# Patient Record
Sex: Female | Born: 1975 | Race: White | Hispanic: Yes | Marital: Single | Smoking: Never smoker
Health system: Southern US, Community
[De-identification: ages and names within clinical notes are randomized; demographics above are authoritative.]

## PROBLEM LIST (undated history)

## (undated) DIAGNOSIS — K219 Gastro-esophageal reflux disease without esophagitis: Secondary | ICD-10-CM

## (undated) DIAGNOSIS — E119 Type 2 diabetes mellitus without complications: Secondary | ICD-10-CM

---

## 2022-01-06 ENCOUNTER — Encounter (HOSPITAL_BASED_OUTPATIENT_CLINIC_OR_DEPARTMENT_OTHER): Payer: Self-pay

## 2022-01-06 ENCOUNTER — Observation Stay (HOSPITAL_COMMUNITY): Payer: BLUE CROSS/BLUE SHIELD

## 2022-01-06 ENCOUNTER — Other Ambulatory Visit: Payer: Self-pay

## 2022-01-06 ENCOUNTER — Observation Stay (HOSPITAL_BASED_OUTPATIENT_CLINIC_OR_DEPARTMENT_OTHER)
Admission: EM | Admit: 2022-01-06 | Discharge: 2022-01-07 | Disposition: A | Discharge status: Home / Self Care | Payer: BLUE CROSS/BLUE SHIELD | Attending: Family Medicine | Admitting: Family Medicine

## 2022-01-06 ENCOUNTER — Emergency Department (HOSPITAL_BASED_OUTPATIENT_CLINIC_OR_DEPARTMENT_OTHER): Payer: BLUE CROSS/BLUE SHIELD

## 2022-01-06 DIAGNOSIS — E119 Type 2 diabetes mellitus without complications: Secondary | ICD-10-CM

## 2022-01-06 DIAGNOSIS — G459 Transient cerebral ischemic attack, unspecified: Principal | ICD-10-CM

## 2022-01-06 DIAGNOSIS — Z20822 Contact with and (suspected) exposure to covid-19: Secondary | ICD-10-CM | POA: Diagnosis not present

## 2022-01-06 DIAGNOSIS — Z6841 Body Mass Index (BMI) 40.0 and over, adult: Secondary | ICD-10-CM | POA: Diagnosis not present

## 2022-01-06 DIAGNOSIS — D509 Iron deficiency anemia, unspecified: Secondary | ICD-10-CM | POA: Diagnosis not present

## 2022-01-06 DIAGNOSIS — R202 Paresthesia of skin: Secondary | ICD-10-CM | POA: Diagnosis present

## 2022-01-06 HISTORY — DX: Type 2 diabetes mellitus without complications: E11.9

## 2022-01-06 HISTORY — DX: Gastro-esophageal reflux disease without esophagitis: K21.9

## 2022-01-06 LAB — CBC WITH DIFFERENTIAL/PLATELET
Abs Immature Granulocytes: 0.03 10*3/uL (ref 0.00–0.07)
Basophils Absolute: 0.1 10*3/uL (ref 0.0–0.1)
Basophils Relative: 1 %
Eosinophils Absolute: 0.2 10*3/uL (ref 0.0–0.5)
Eosinophils Relative: 3 %
HCT: 35.1 % — ABNORMAL LOW (ref 36.0–46.0)
Hemoglobin: 11.1 g/dL — ABNORMAL LOW (ref 12.0–15.0)
Immature Granulocytes: 0 %
Lymphocytes Relative: 32 %
Lymphs Abs: 2.6 10*3/uL (ref 0.7–4.0)
MCH: 22.8 pg — ABNORMAL LOW (ref 26.0–34.0)
MCHC: 31.6 g/dL (ref 30.0–36.0)
MCV: 72.1 fL — ABNORMAL LOW (ref 80.0–100.0)
Monocytes Absolute: 0.6 10*3/uL (ref 0.1–1.0)
Monocytes Relative: 7 %
Neutro Abs: 4.7 10*3/uL (ref 1.7–7.7)
Neutrophils Relative %: 57 %
Platelets: 333 10*3/uL (ref 150–400)
RBC: 4.87 MIL/uL (ref 3.87–5.11)
RDW: 15 % (ref 11.5–15.5)
WBC: 8.3 10*3/uL (ref 4.0–10.5)

## 2022-01-06 LAB — URINALYSIS, ROUTINE W REFLEX MICROSCOPIC
Bilirubin Urine: NEGATIVE
Glucose, UA: NEGATIVE mg/dL
Ketones, ur: NEGATIVE mg/dL
Leukocytes,Ua: NEGATIVE
Nitrite: NEGATIVE
Protein, ur: NEGATIVE mg/dL
Specific Gravity, Urine: 1.01 (ref 1.005–1.030)
pH: 6.5 (ref 5.0–8.0)

## 2022-01-06 LAB — LIPID PANEL
Cholesterol: 202 mg/dL — ABNORMAL HIGH (ref 0–200)
HDL: 53 mg/dL (ref >40)
LDL Cholesterol: 133 mg/dL — ABNORMAL HIGH (ref 0–99)
Total CHOL/HDL Ratio: 3.8 RATIO
Triglycerides: 80 mg/dL (ref <150)
VLDL: 16 mg/dL (ref 0–40)

## 2022-01-06 LAB — ETHANOL: Alcohol, Ethyl (B): <10 mg/dL (ref <10)

## 2022-01-06 LAB — URINALYSIS, MICROSCOPIC (REFLEX)

## 2022-01-06 LAB — COMPREHENSIVE METABOLIC PANEL
ALT: 22 U/L (ref 0–44)
AST: 22 U/L (ref 15–41)
Albumin: 3.7 g/dL (ref 3.5–5.0)
Alkaline Phosphatase: 68 U/L (ref 38–126)
Anion gap: 8 (ref 5–15)
BUN: 13 mg/dL (ref 6–20)
CO2: 25 mmol/L (ref 22–32)
Calcium: 9.2 mg/dL (ref 8.9–10.3)
Chloride: 102 mmol/L (ref 98–111)
Creatinine, Ser: 0.58 mg/dL (ref 0.44–1.00)
GFR, Estimated: >60 mL/min (ref >60)
Glucose, Bld: 113 mg/dL — ABNORMAL HIGH (ref 70–99)
Potassium: 3.8 mmol/L (ref 3.5–5.1)
Sodium: 135 mmol/L (ref 135–145)
Total Bilirubin: 0.2 mg/dL — ABNORMAL LOW (ref 0.3–1.2)
Total Protein: 7.3 g/dL (ref 6.5–8.1)

## 2022-01-06 LAB — RESP PANEL BY RT-PCR (FLU A&B, COVID) ARPGX2
Influenza A by PCR: NEGATIVE
Influenza B by PCR: NEGATIVE
SARS Coronavirus 2 by RT PCR: NEGATIVE

## 2022-01-06 LAB — SEDIMENTATION RATE: Sed Rate: 25 mm/hr — ABNORMAL HIGH (ref 0–22)

## 2022-01-06 LAB — RAPID URINE DRUG SCREEN, HOSP PERFORMED
Amphetamines: NEGATIVE — AB
Barbiturates: NEGATIVE — AB
Benzodiazepines: NEGATIVE — AB
Cocaine: NEGATIVE — AB
Opiates: NEGATIVE — AB
Tetrahydrocannabinol: NEGATIVE — AB

## 2022-01-06 LAB — HEMOGLOBIN A1C
Hgb A1c MFr Bld: 6.2 % — ABNORMAL HIGH (ref 4.8–5.6)
Mean Plasma Glucose: 131.24 mg/dL

## 2022-01-06 LAB — APTT: aPTT: 27 seconds (ref 24–36)

## 2022-01-06 LAB — HIV ANTIBODY (ROUTINE TESTING W REFLEX): HIV Screen 4th Generation wRfx: NONREACTIVE

## 2022-01-06 LAB — IRON AND TIBC
Iron: 49 ug/dL (ref 28–170)
Saturation Ratios: 10 % — ABNORMAL LOW (ref 10.4–31.8)
TIBC: 476 ug/dL — ABNORMAL HIGH (ref 250–450)
UIBC: 427 ug/dL

## 2022-01-06 LAB — PROTIME-INR
INR: 0.9 (ref 0.8–1.2)
Prothrombin Time: 12.4 seconds (ref 11.4–15.2)

## 2022-01-06 LAB — CBG MONITORING, ED: Glucose-Capillary: 109 mg/dL — ABNORMAL HIGH (ref 70–99)

## 2022-01-06 LAB — C-REACTIVE PROTEIN: CRP: 0.6 mg/dL (ref <1.0)

## 2022-01-06 LAB — HCG, QUANTITATIVE, PREGNANCY: hCG, Beta Chain, Quant, S: <1 m[IU]/mL (ref <5)

## 2022-01-06 LAB — TSH: TSH: 2.702 u[IU]/mL (ref 0.350–4.500)

## 2022-01-06 IMAGING — MR MR HEAD W/O CM
6 of 10 series · 29 of 48 positions shown · IV contrast (gadavist)
Comparison: None

CLINICAL DATA: Neuro deficit, acute, stroke suspected; dural venous
sinus thrombus suspected

EXAM:
MRI HEAD WITHOUT CONTRAST
MRA HEAD WITHOUT CONTRAST
MRA NECK WITHOUT AND WITH CONTRAST
MRV HEAD WITHOUT AND WITH CONTRAST
TECHNIQUE: Multiplanar, multi-echo pulse sequences of the brain and surrounding
structures were acquired without intravenous contrast. Angiographic
images of the Circle of Willis were acquired using MRA technique
without intravenous contrast. Angiographic images of the neck were
acquired using MRA technique without and with intravenous contrast.
Carotid stenosis measurements (when applicable) are obtained
utilizing NASCET criteria, using the distal internal carotid
diameter as the denominator. Angiographic images of the head were
obtained using MRV technique without and with intravenous contrast.
CONTRAST:  10mL GADAVIST GADOBUTROL 1 MMOL/ML IV SOLN

[Series 2: DWI · axial · 3.0mm · 0.94mm/px · z∈[-37,+109]mm · 9 of 100 slices shown (1 of 2)]
[im 1/100]
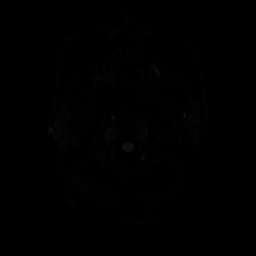
[im 13/100]
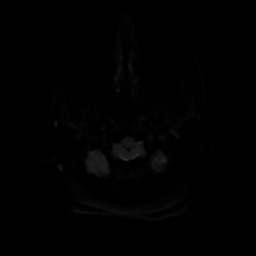
[im 25/100]
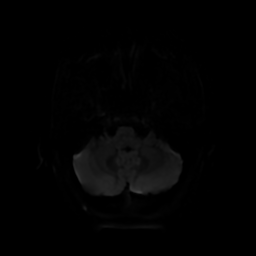
[im 38/100]
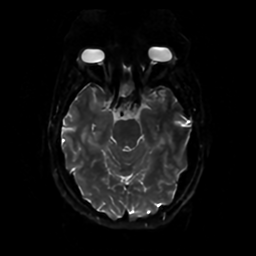
[im 50/100]
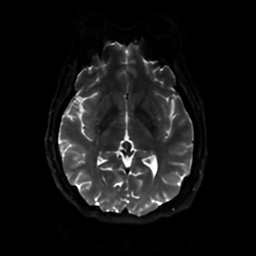
[im 62/100]
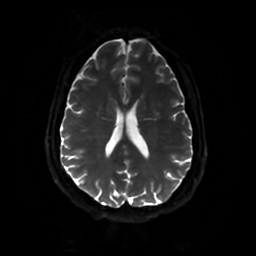
[im 75/100]
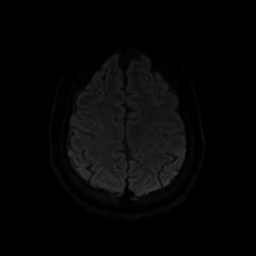
[im 87/100]
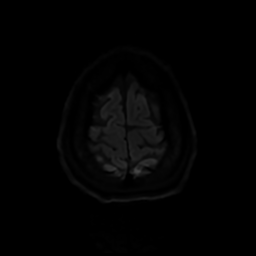
[im 100/100]
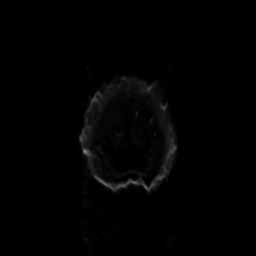

[Series 3: DWI · coronal · 4.0mm · 0.94mm/px · 7 of 72 slices shown (2 of 2)]
[im 1/72]
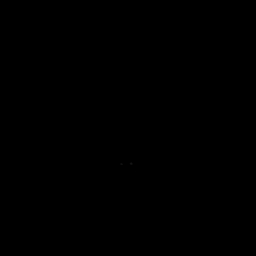
[im 12/72]
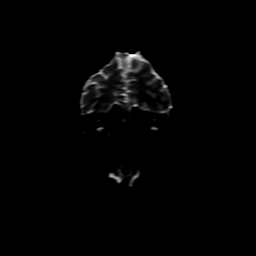
[im 24/72]
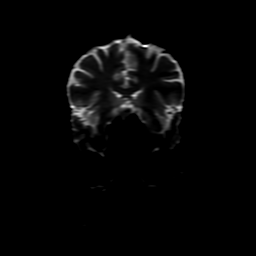
[im 36/72]
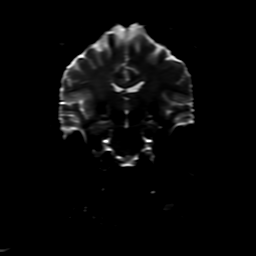
[im 48/72]
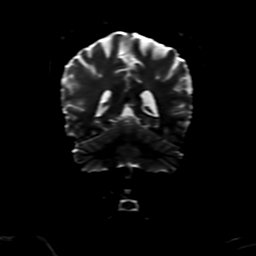
[im 60/72]
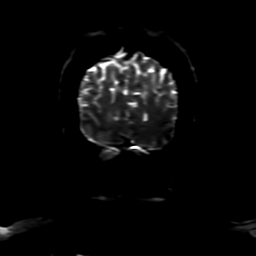
[im 72/72]
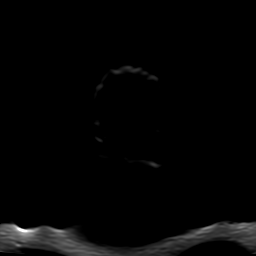

[Series 4: FLAIR · sagittal · 5.0mm · 0.23mm/px · 2 of 24 slices shown (1 of 2)]
[im 1/24]
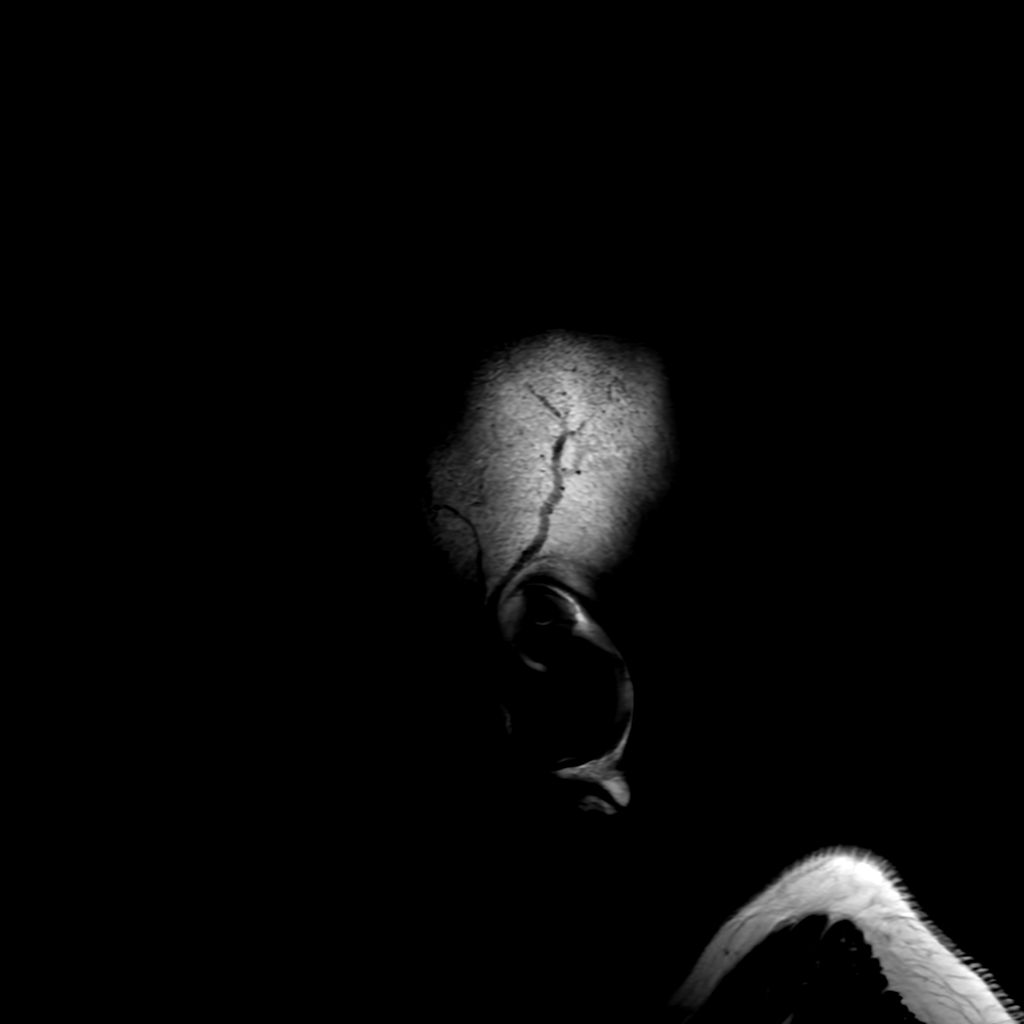
[im 24/24]
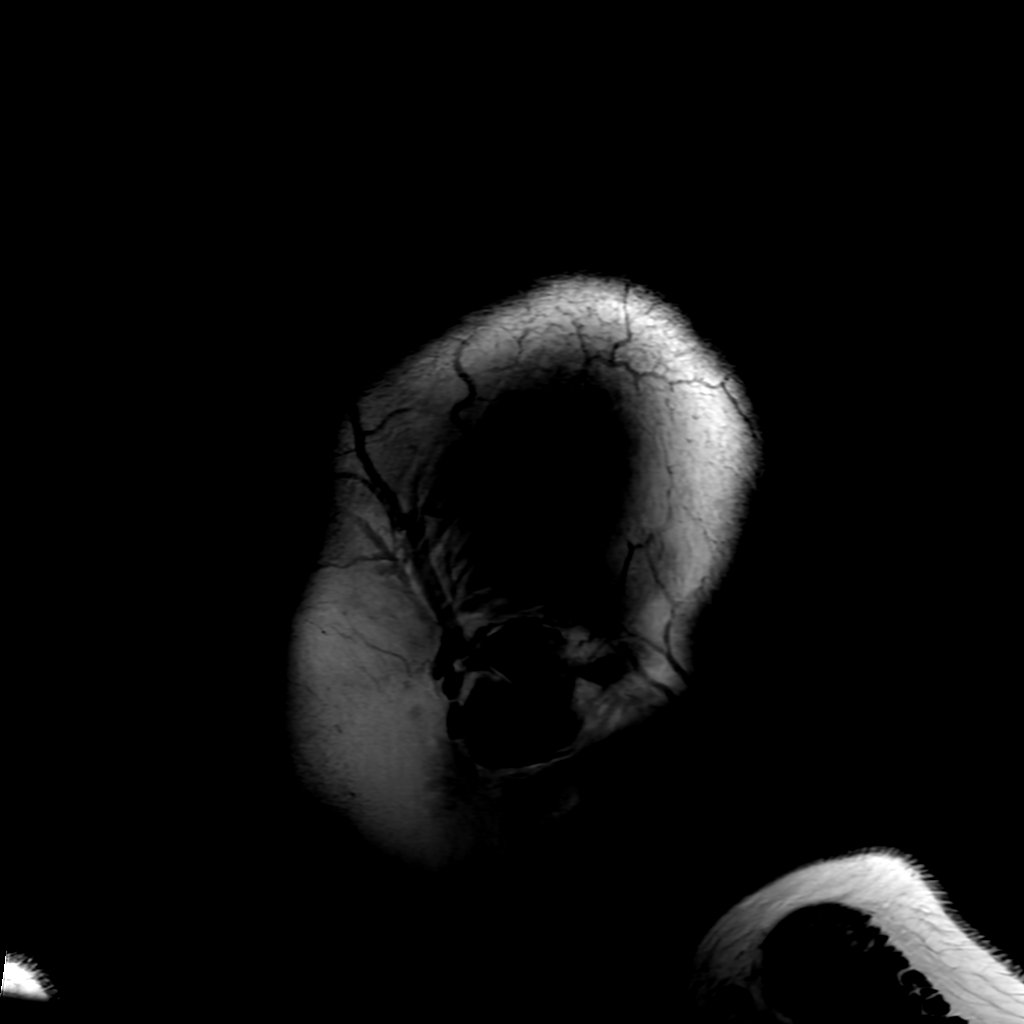

[Series 6: FLAIR · axial · 4.0mm · 0.45mm/px · z∈[-35,+109]mm · 3 of 34 slices shown (2 of 2)]
[im 1/34]
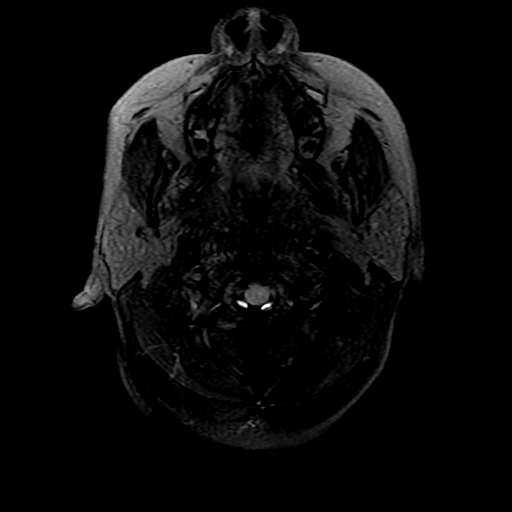
[im 17/34]
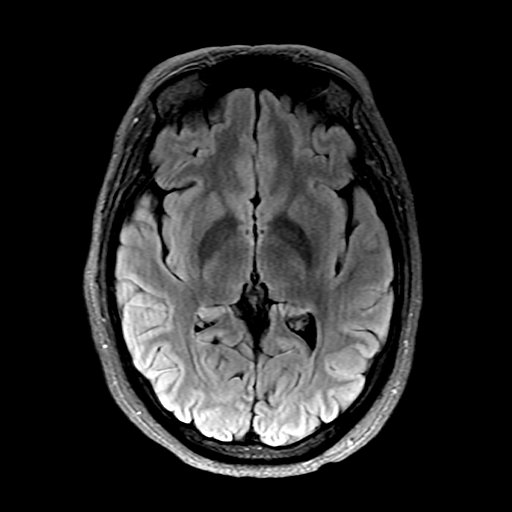
[im 34/34]
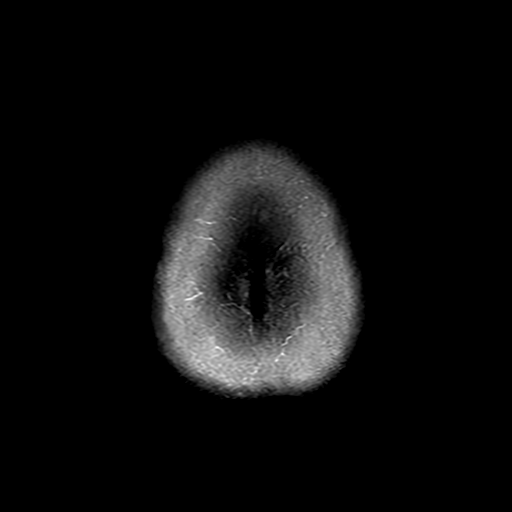

[Series 250: ADC · axial · 3.0mm · 0.94mm/px · z∈[-37,+109]mm · 5 of 49 slices shown (1 of 2)]
[im 1/49]
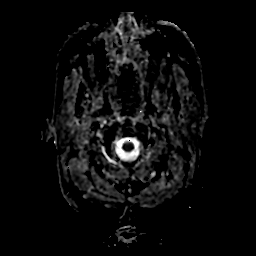
[im 13/49]
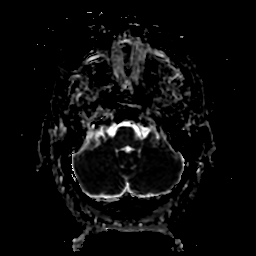
[im 25/49]
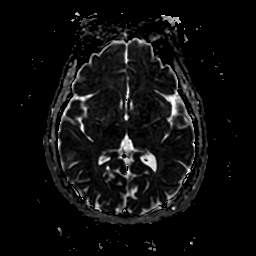
[im 37/49]
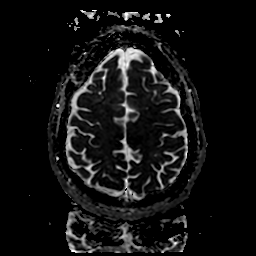
[im 49/49]
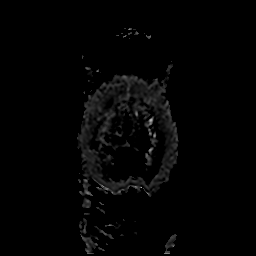

[Series 350: ADC · coronal · 4.0mm · 0.94mm/px · 3 of 36 slices shown (2 of 2)]
[im 1/36]
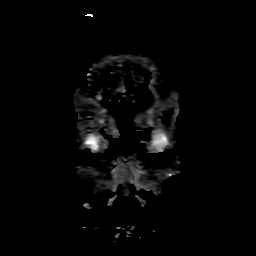
[im 18/36]
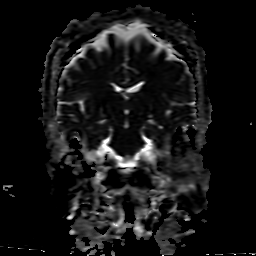
[im 36/36]
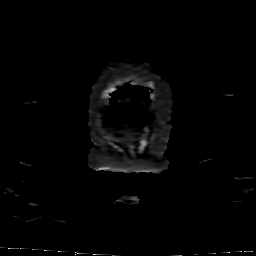

[29 of 48 positions shown; findings below may reference images not displayed]

FINDINGS: MRI HEAD

Brain: There is no acute infarction or intracranial hemorrhage.
There is no intracranial mass, mass effect, or edema. There is no
hydrocephalus or extra-axial fluid collection. Ventricles and sulci
are normal in size and configuration. A punctate focus of T2
hyperintensity in the left frontal white matter likely reflects
nonspecific gliosis/demyelination of doubtful significance.

Vascular: Major vessel flow voids at the skull base are preserved.

Skull and upper cervical spine: Normal marrow signal is preserved.

Sinuses/Orbits: Paranasal sinuses are aerated. Orbits are
unremarkable.

Other: Sella is unremarkable.  Mastoid air cells are clear.

MRA HEAD

Intracranial internal carotid arteries are patent. Middle and
anterior cerebral arteries are patent. Intracranial vertebral
arteries, basilar artery, posterior cerebral arteries are patent.
Left posterior communicating artery is present. There is no
significant stenosis or aneurysm.

MRA NECK

Common, internal, and external carotid arteries are patent.
Codominant vertebral arteries are patent. No hemodynamically
significant stenosis or evidence of dissection.

MRV HEAD

Superior sagittal sinus, straight sinus, vein of COSME, and both
internal cerebral veins are patent. Transverse and sigmoid sinuses
are patent. No evidence of dural sinus thrombosis.
IMPRESSION: No acute or significant intracranial abnormality.

Unremarkable vascular imaging.

## 2022-01-06 IMAGING — MR MR MRA NECK WO/W CM
4 of 7 series · 18 of 48 positions shown · IV contrast (gadavist)
Comparison: None

CLINICAL DATA: Neuro deficit, acute, stroke suspected; dural venous
sinus thrombus suspected

EXAM:
MRI HEAD WITHOUT CONTRAST
MRA HEAD WITHOUT CONTRAST
MRA NECK WITHOUT AND WITH CONTRAST
MRV HEAD WITHOUT AND WITH CONTRAST
TECHNIQUE: Multiplanar, multi-echo pulse sequences of the brain and surrounding
structures were acquired without intravenous contrast. Angiographic
images of the Circle of Willis were acquired using MRA technique
without intravenous contrast. Angiographic images of the neck were
acquired using MRA technique without and with intravenous contrast.
Carotid stenosis measurements (when applicable) are obtained
utilizing NASCET criteria, using the distal internal carotid
diameter as the denominator. Angiographic images of the head were
obtained using MRV technique without and with intravenous contrast.
CONTRAST:  10mL GADAVIST GADOBUTROL 1 MMOL/ML IV SOLN

[Series 1800: cor cemra ft · coronal · 1.2mm · 0.59mm/px · 7 of 105 slices shown]
[im 1/105]
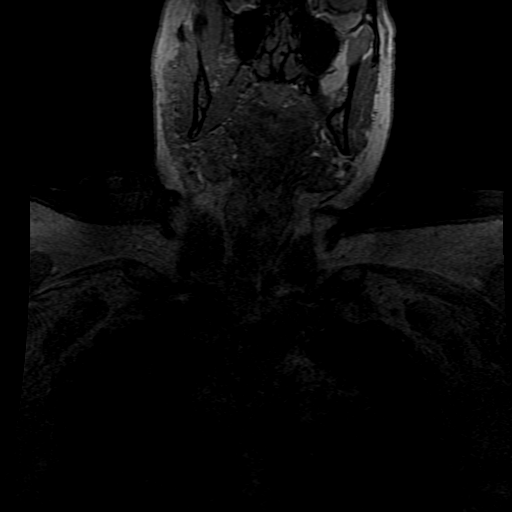
[im 18/105]
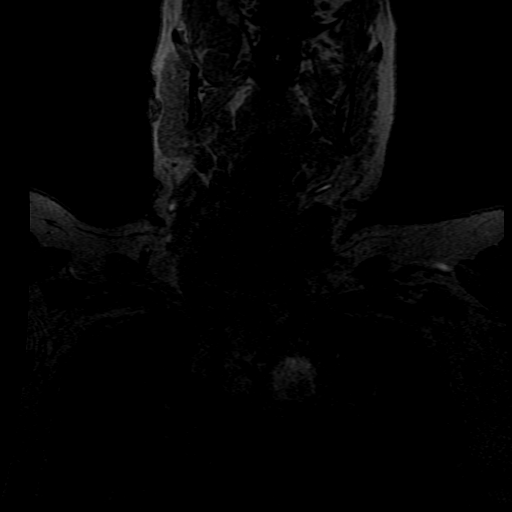
[im 35/105]
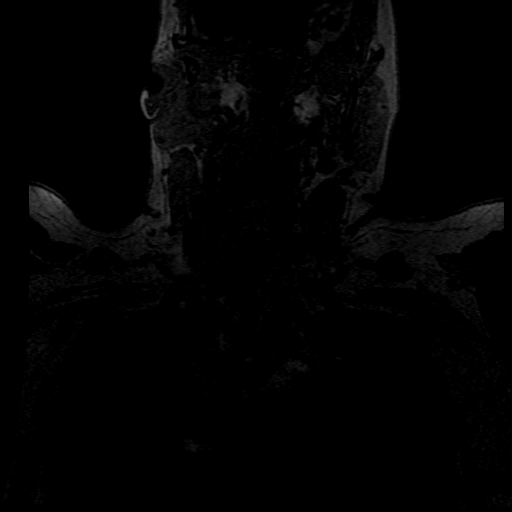
[im 53/105]
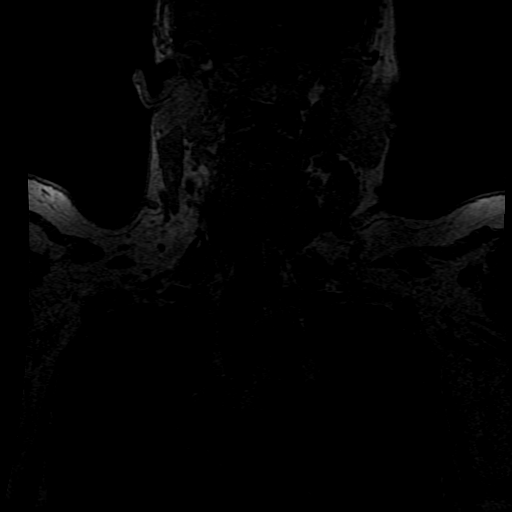
[im 70/105]
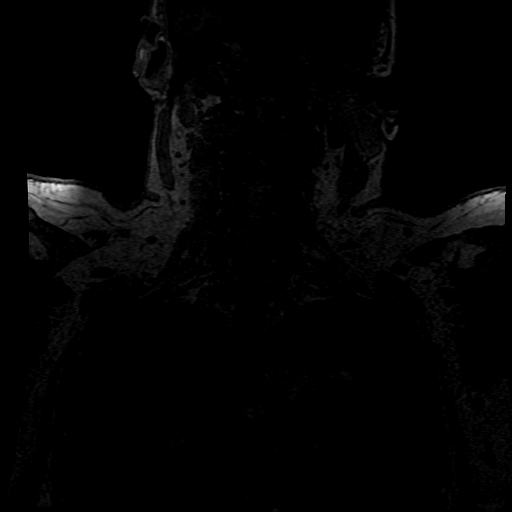
[im 87/105]
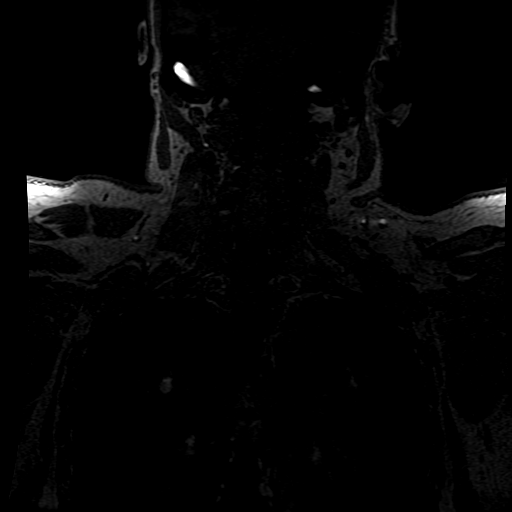
[im 105/105]
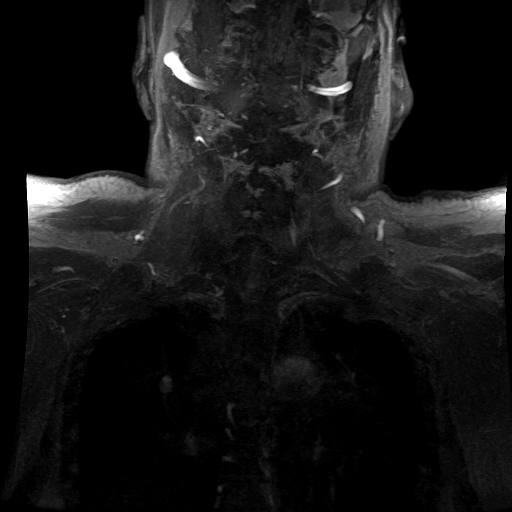

[Series 1801: ph1/cor cemra ft · coronal · 1.2mm · 0.59mm/px · 5 of 104 slices shown]
[im 1/104]
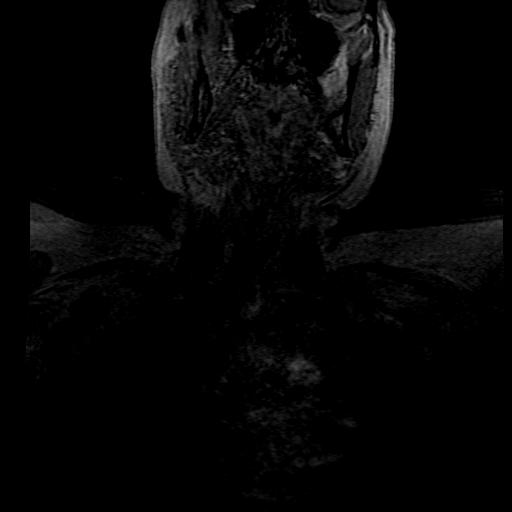
[im 18/104]
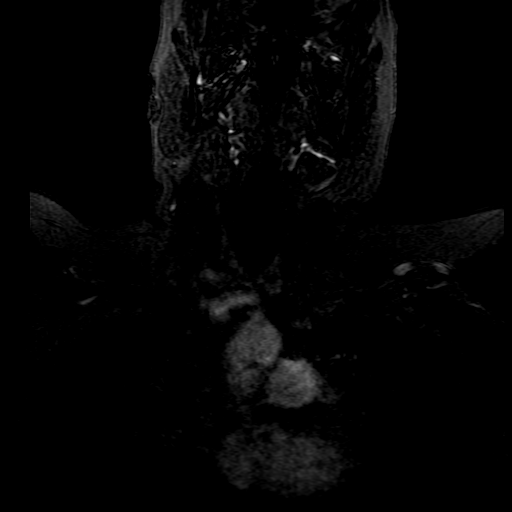
[im 35/104]
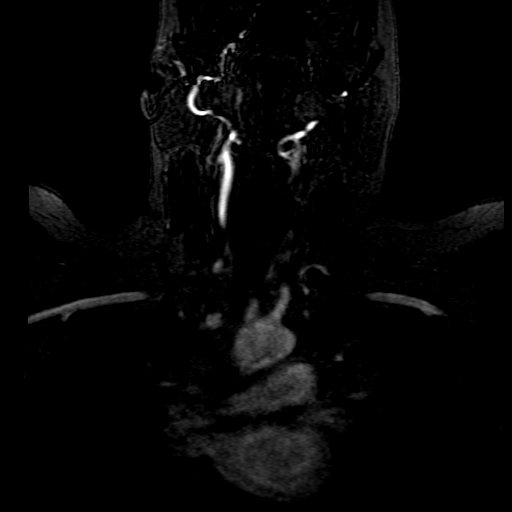
[im 52/104]
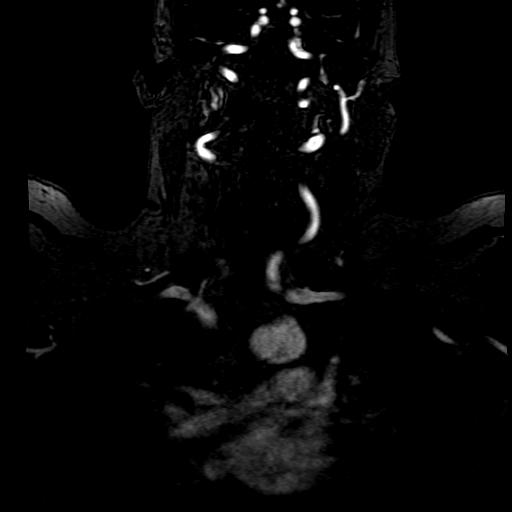
[im 86/104]
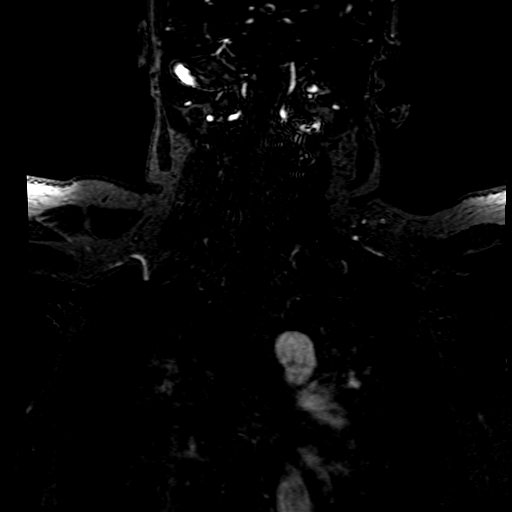

[Series 1802: ph2/cor cemra ft · coronal · 1.2mm · 0.59mm/px · 3 of 105 slices shown]
[im 18/105]
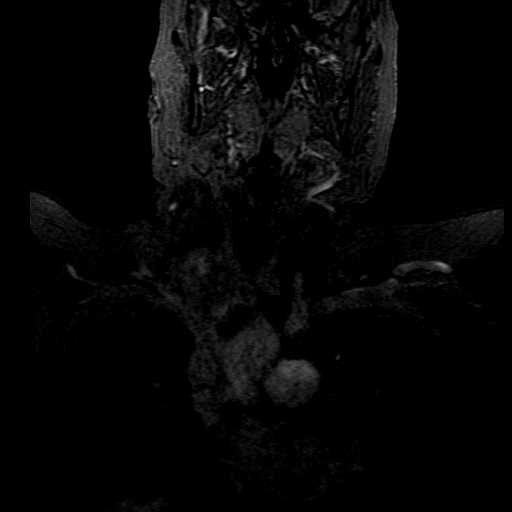
[im 53/105]
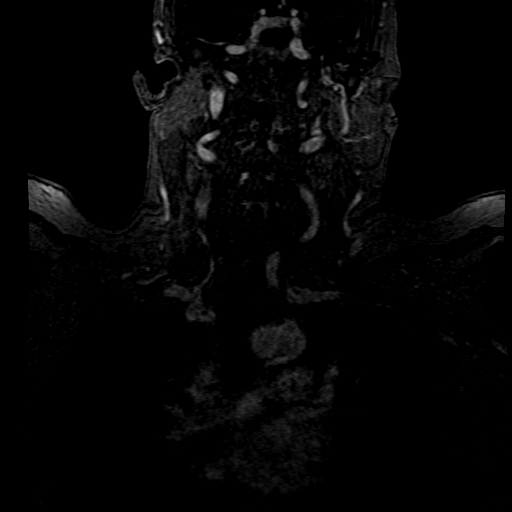
[im 87/105]
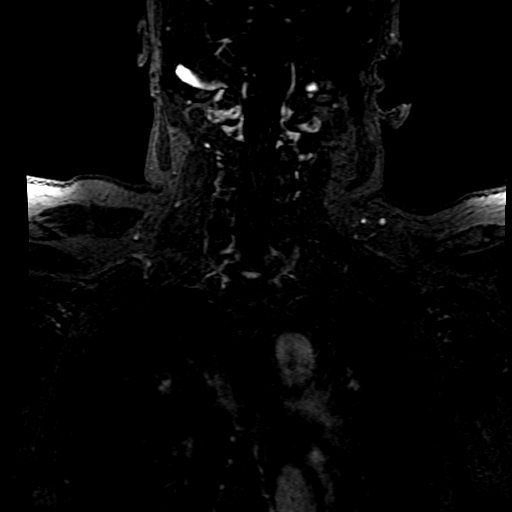

[((date))-((date)) · coronal · 1.2mm · 0.59mm/px · 3 of 105 slices shown]
[im 18/105]
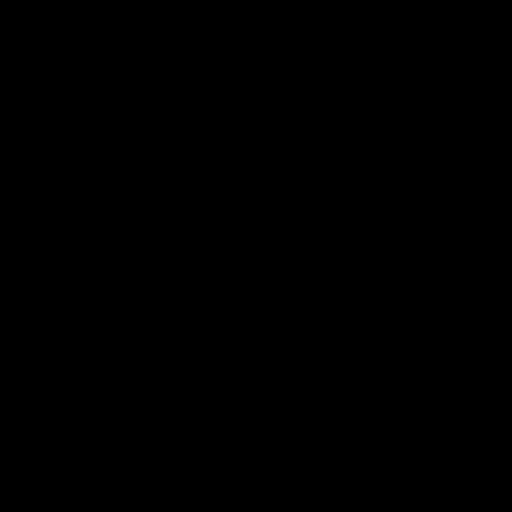
[im 53/105]
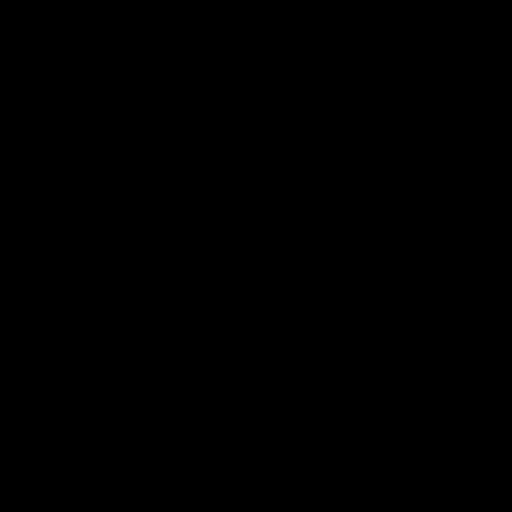
[im 87/105]
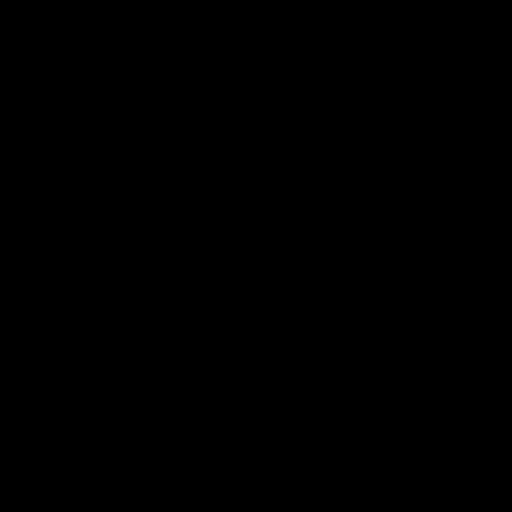

[18 of 48 positions shown; findings below may reference images not displayed]

FINDINGS: MRI HEAD

Brain: There is no acute infarction or intracranial hemorrhage.
There is no intracranial mass, mass effect, or edema. There is no
hydrocephalus or extra-axial fluid collection. Ventricles and sulci
are normal in size and configuration. A punctate focus of T2
hyperintensity in the left frontal white matter likely reflects
nonspecific gliosis/demyelination of doubtful significance.

Vascular: Major vessel flow voids at the skull base are preserved.

Skull and upper cervical spine: Normal marrow signal is preserved.

Sinuses/Orbits: Paranasal sinuses are aerated. Orbits are
unremarkable.

Other: Sella is unremarkable.  Mastoid air cells are clear.

MRA HEAD

Intracranial internal carotid arteries are patent. Middle and
anterior cerebral arteries are patent. Intracranial vertebral
arteries, basilar artery, posterior cerebral arteries are patent.
Left posterior communicating artery is present. There is no
significant stenosis or aneurysm.

MRA NECK

Common, internal, and external carotid arteries are patent.
Codominant vertebral arteries are patent. No hemodynamically
significant stenosis or evidence of dissection.

MRV HEAD

Superior sagittal sinus, straight sinus, vein of COSME, and both
internal cerebral veins are patent. Transverse and sigmoid sinuses
are patent. No evidence of dural sinus thrombosis.
IMPRESSION: No acute or significant intracranial abnormality.

Unremarkable vascular imaging.

## 2022-01-06 IMAGING — CT CT HEAD W/O CM
4 series · 16 of 47 positions shown, 18 images · non-contrast
Comparison: None.

CLINICAL DATA: Blurred vision.



[Series 2: head wo · axial · 0.42mm/px · z∈[-157,-37]mm · 7 of 33 slices shown, 9 images]
[im 5/33  brain]
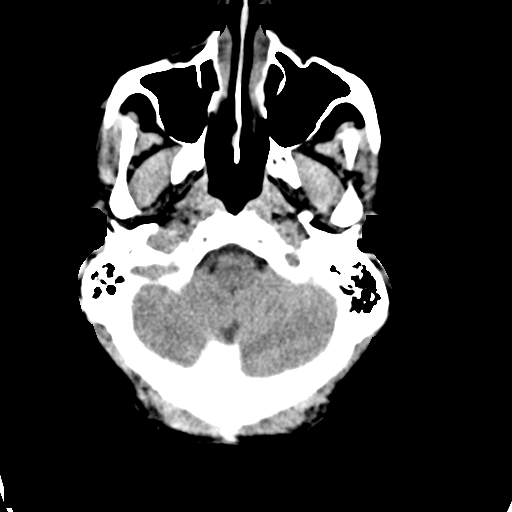
[im 5/33  bone]
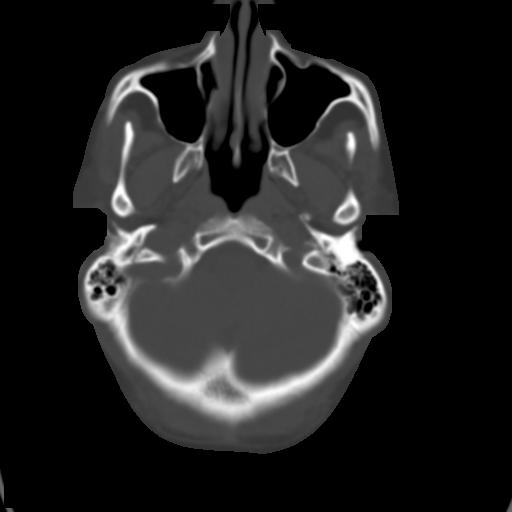
[im 9/33  brain]
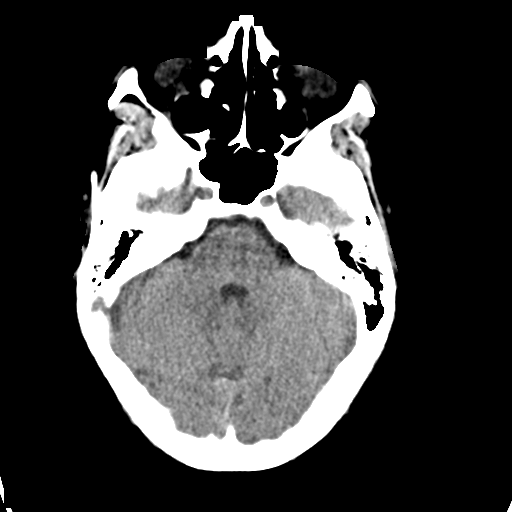
[im 13/33  brain]
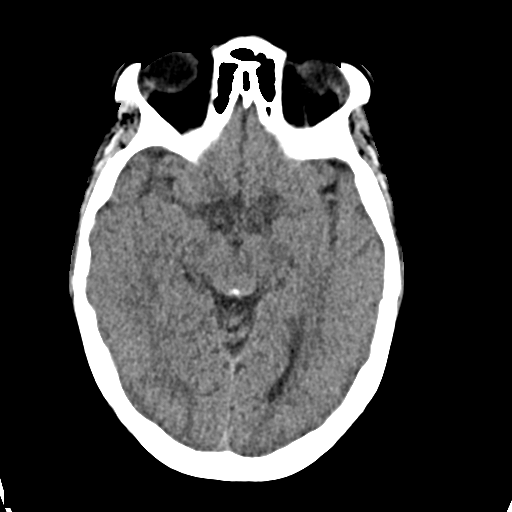
[im 17/33  brain]
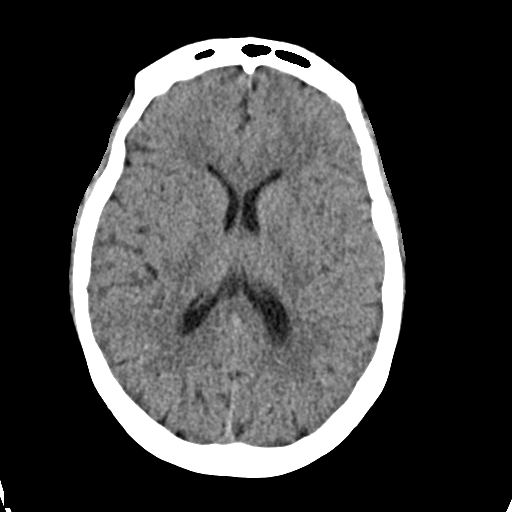
[im 21/33  brain]
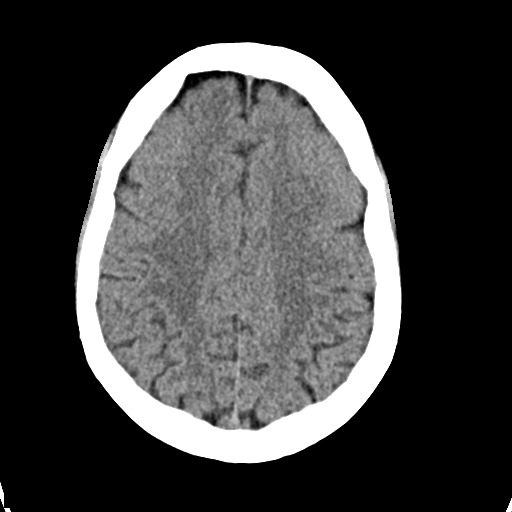
[im 21/33  bone]
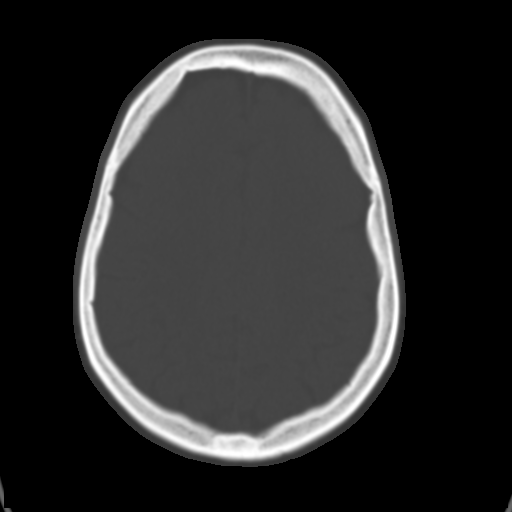
[im 25/33  brain]
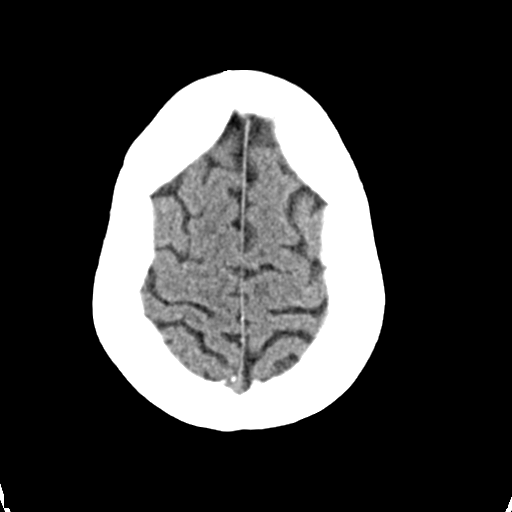
[im 29/33  brain]
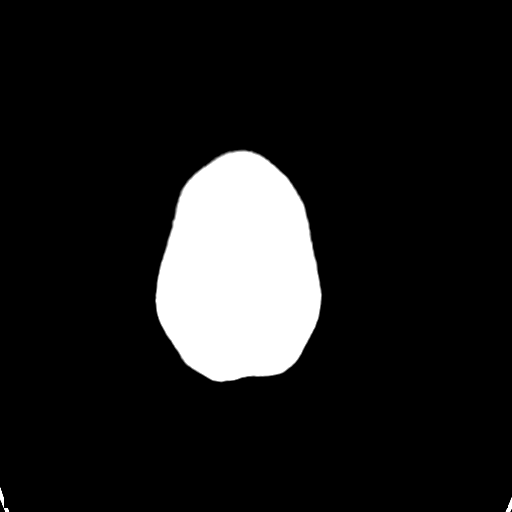

[Series 3: head bone · axial · 0.42mm/px · z∈[-161,-129]mm · 3 of 83 slices shown]
[im 9/83  bone]
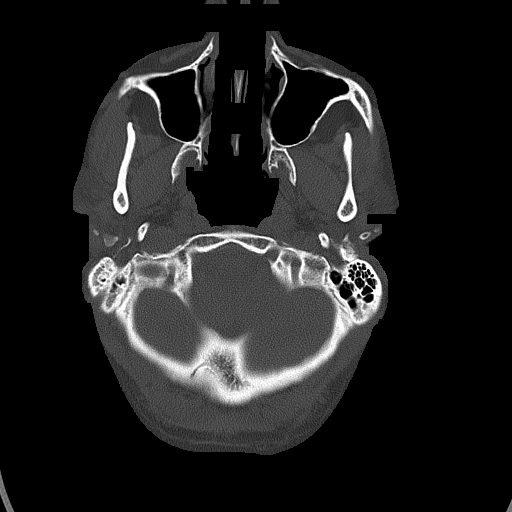
[im 17/83  bone]
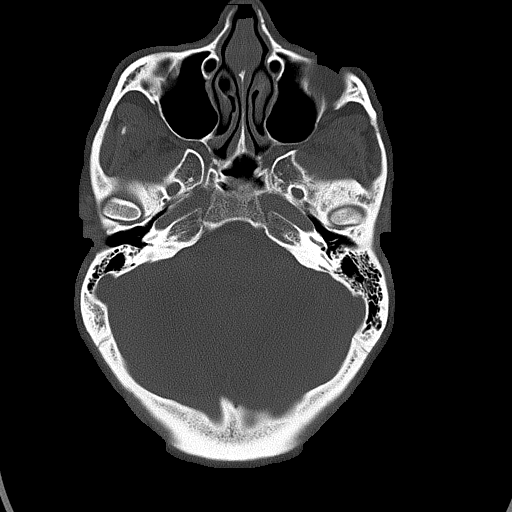
[im 25/83  bone]
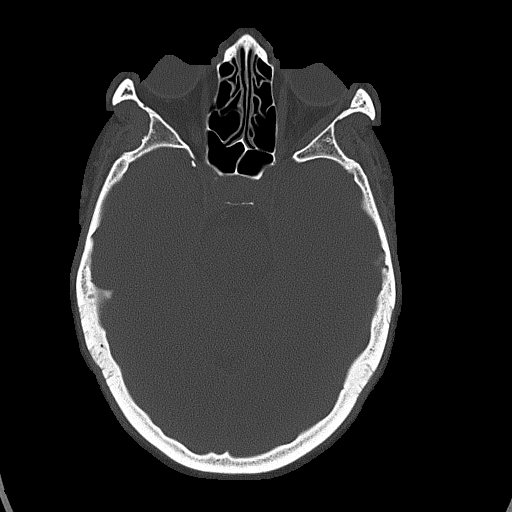

[Series 4: coronal soft · coronal · 0.32mm/px · 3 of 69 slices shown]
[im 23/69  brain]
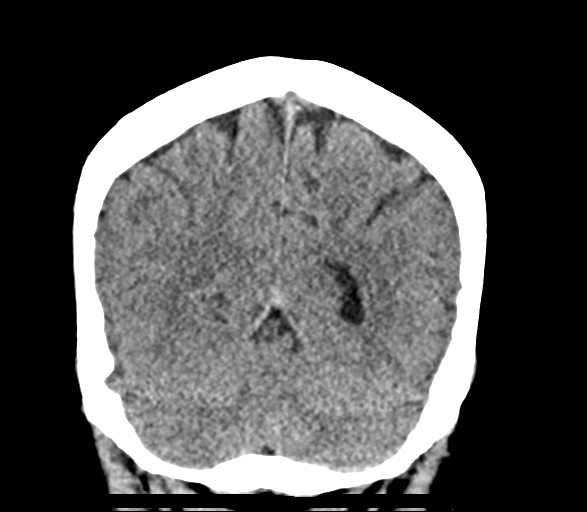
[im 31/69  brain]
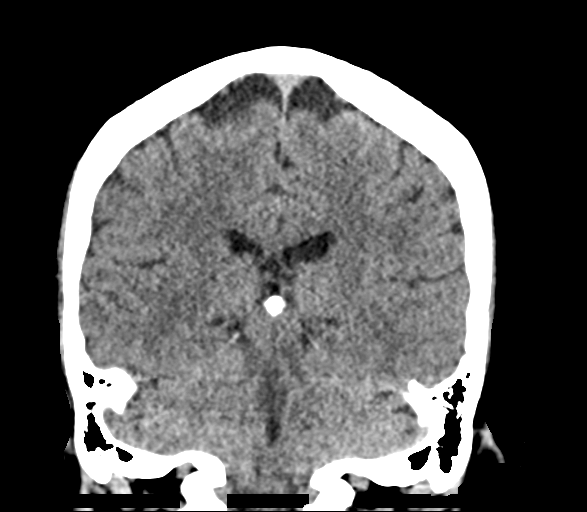
[im 38/69  brain]
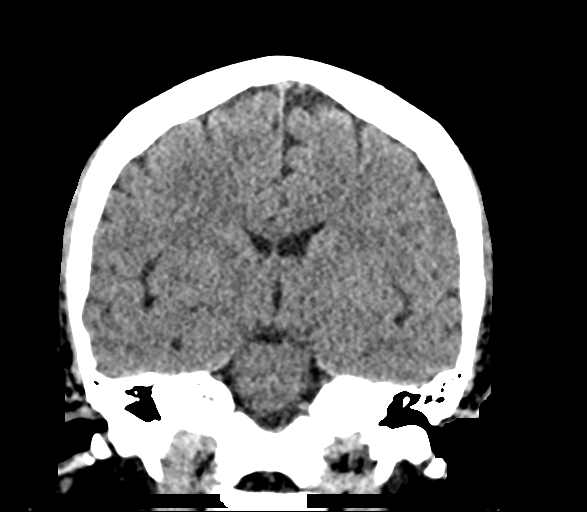

[Series 5: sag soft · sagittal · 0.32mm/px · 3 of 67 slices shown]
[im 23/67  brain]
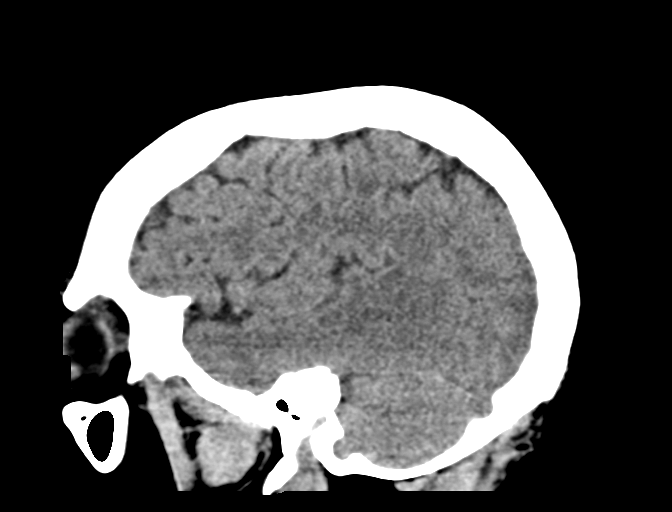
[im 34/67  brain]
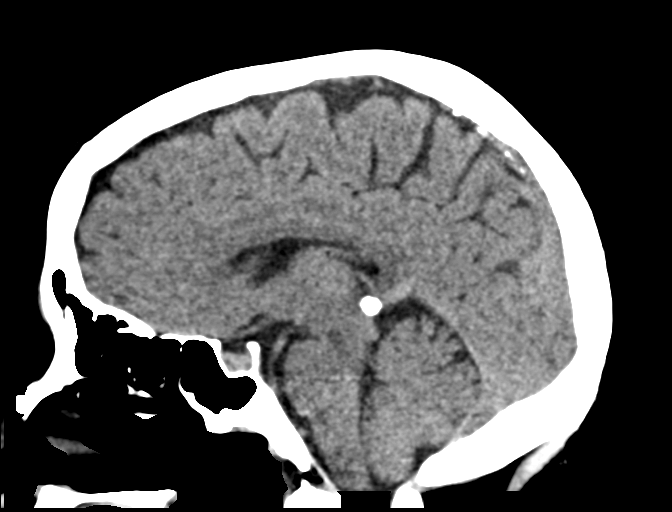
[im 45/67  brain]
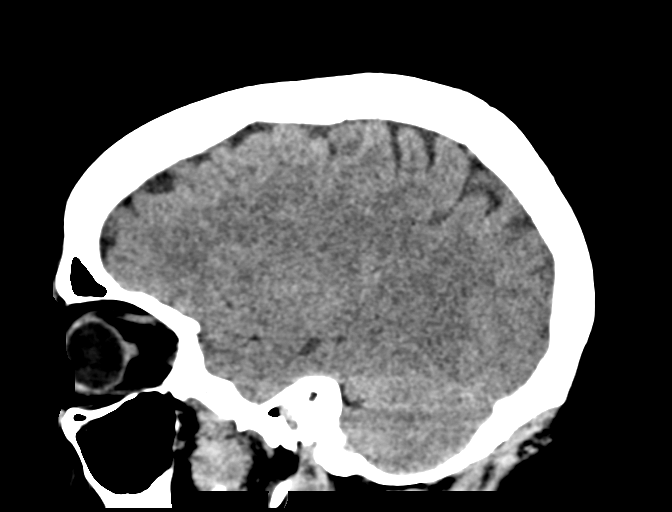

[16 of 47 positions shown; findings below may reference images not displayed]

FINDINGS: Brain: No evidence of acute infarction, hemorrhage, hydrocephalus,
extra-axial collection or mass lesion/mass effect.

Vascular: No hyperdense vessel or unexpected calcification.

Skull: Normal. Negative for fracture or focal lesion.

Sinuses/Orbits: No acute finding.

Other: None.
IMPRESSION: No acute intracranial pathology.

## 2022-01-06 IMAGING — MR MR MRV HEAD WO/W CM
4 of 5 series · 18 of 48 positions shown · IV contrast (gadavist)
Comparison: None

CLINICAL DATA: Neuro deficit, acute, stroke suspected; dural venous
sinus thrombus suspected

EXAM:
MRI HEAD WITHOUT CONTRAST
MRA HEAD WITHOUT CONTRAST
MRA NECK WITHOUT AND WITH CONTRAST
MRV HEAD WITHOUT AND WITH CONTRAST
TECHNIQUE: Multiplanar, multi-echo pulse sequences of the brain and surrounding
structures were acquired without intravenous contrast. Angiographic
images of the Circle of Willis were acquired using MRA technique
without intravenous contrast. Angiographic images of the neck were
acquired using MRA technique without and with intravenous contrast.
Carotid stenosis measurements (when applicable) are obtained
utilizing NASCET criteria, using the distal internal carotid
diameter as the denominator. Angiographic images of the head were
obtained using MRV technique without and with intravenous contrast.
CONTRAST:  10mL GADAVIST GADOBUTROL 1 MMOL/ML IV SOLN

[Series 9: MRV · coronal · 1.5mm · 0.43mm/px · 6 of 128 slices shown]
[im 1/128]
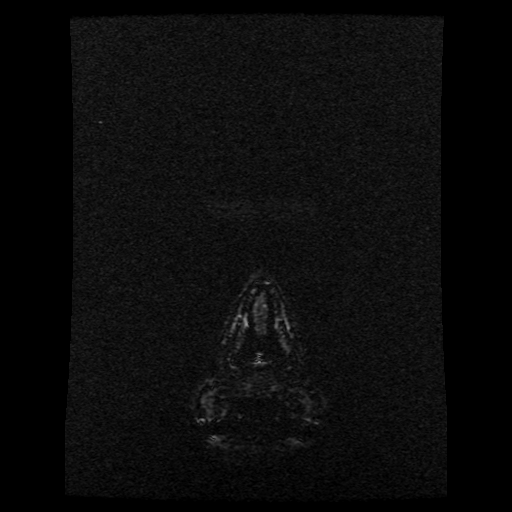
[im 26/128]
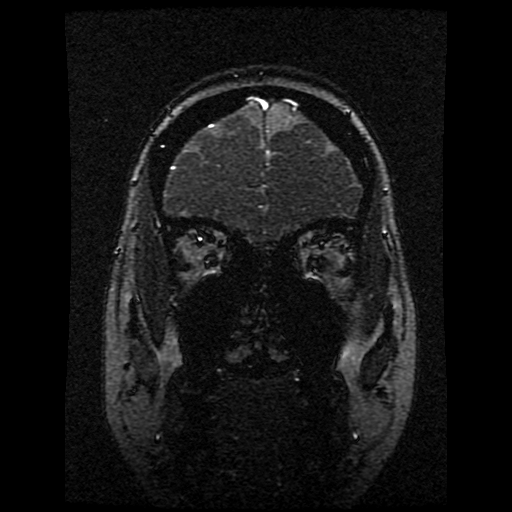
[im 51/128]
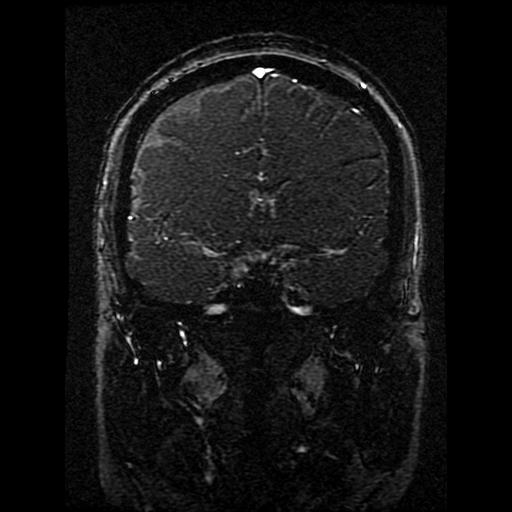
[im 77/128]
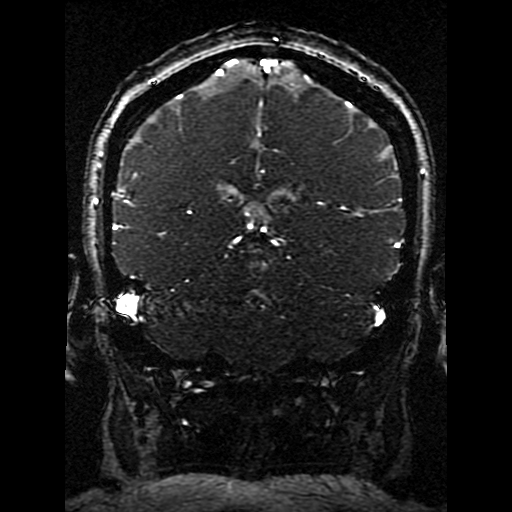
[im 102/128]
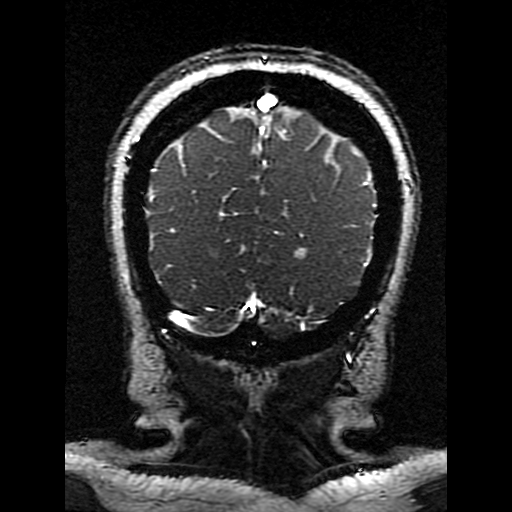
[im 128/128]
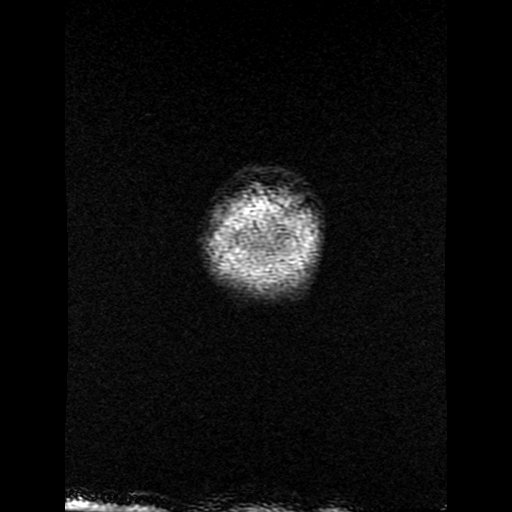

[Series 10: sag inhance (id) · sagittal · 1.8mm · 0.47mm/px · 6 of 327 slices shown]
[im 1/327]
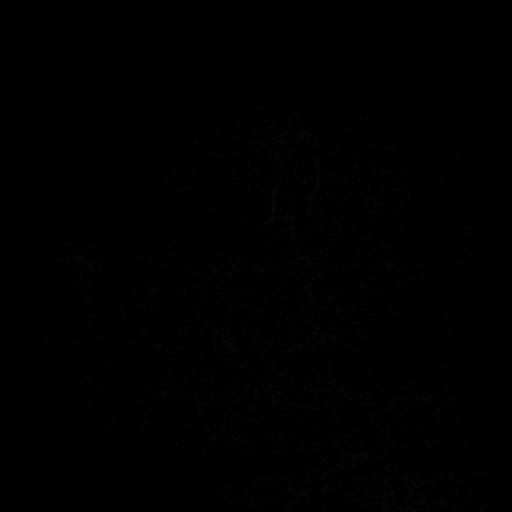
[im 47/327]
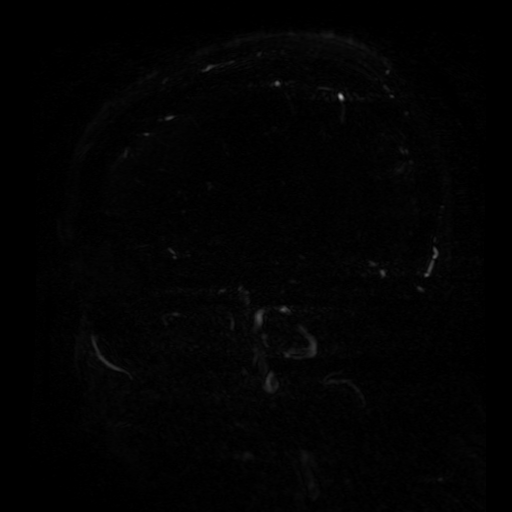
[im 94/327]
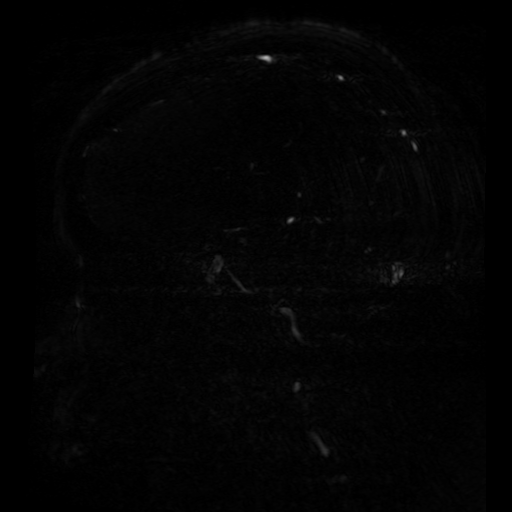
[im 140/327]
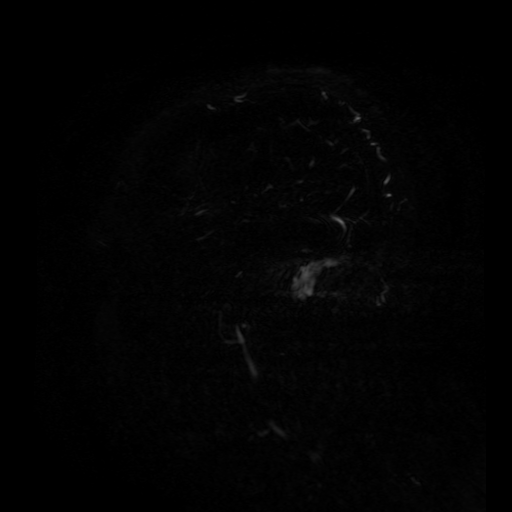
[im 164/327]
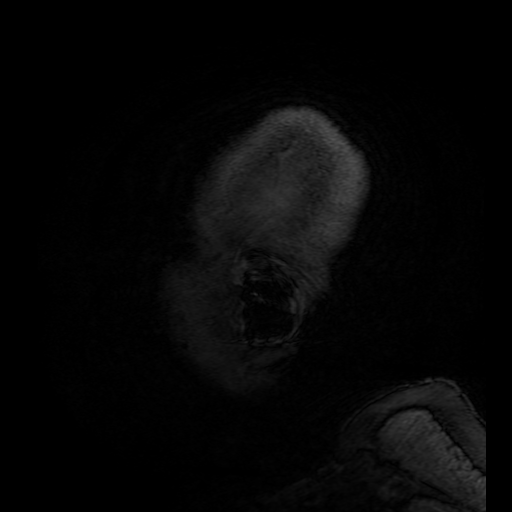
[im 280/327]
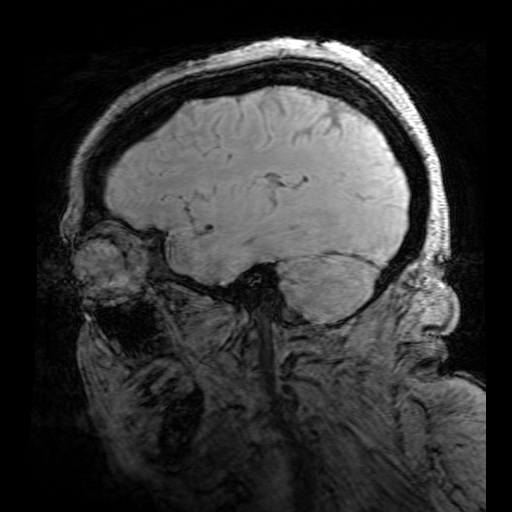

[Series 1900: multiplanar reconstruction (mpr) · sagittal · 0.9mm · 0.47mm/px · 3 of 205 slices shown (1 of 2)]
[im 26/205]
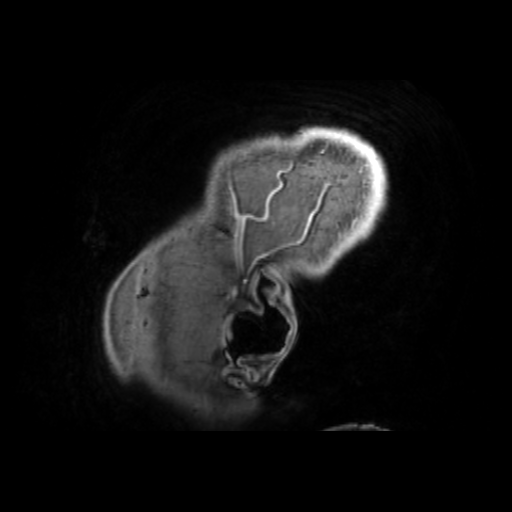
[im 103/205]
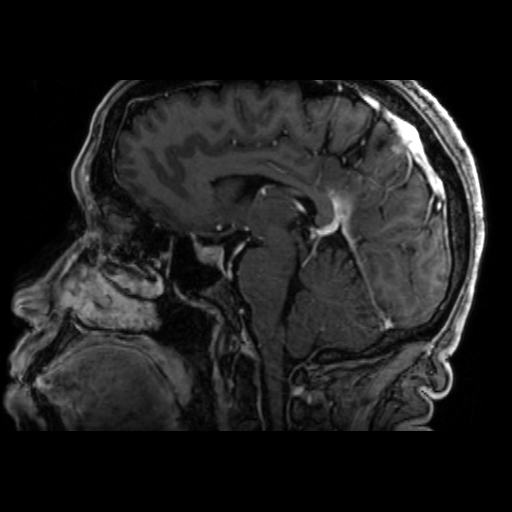
[im 179/205]
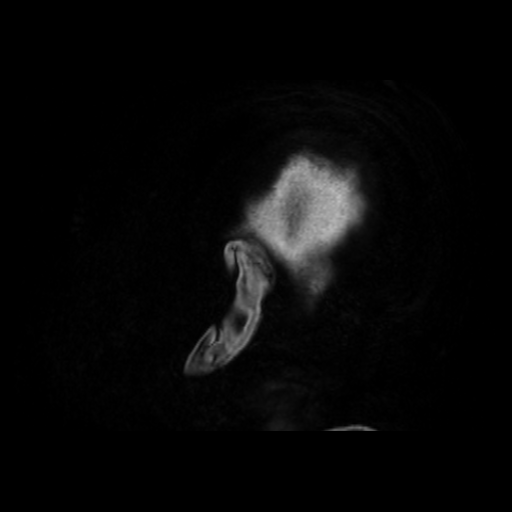

[Series 1901: multiplanar reconstruction (mpr) · coronal · 0.9mm · 0.47mm/px · 3 of 255 slices shown (2 of 2)]
[im 26/255]
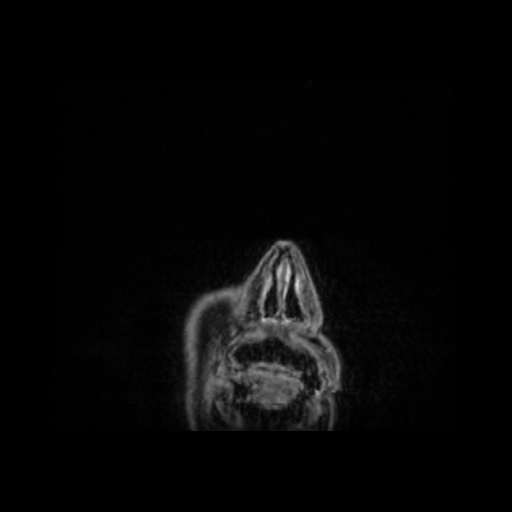
[im 128/255]
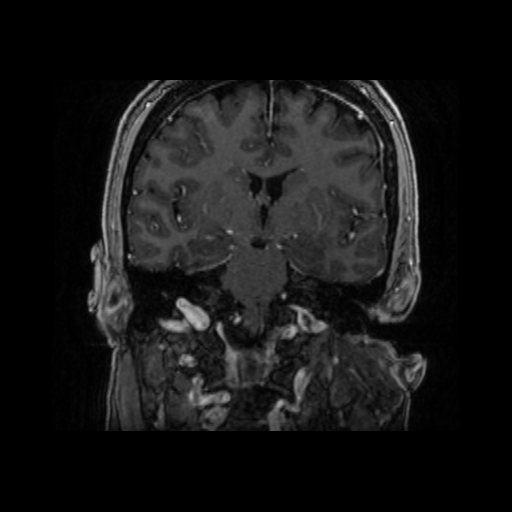
[im 229/255]
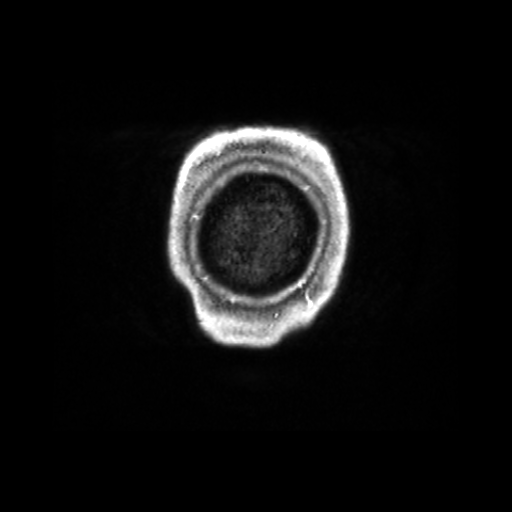

[18 of 48 positions shown; findings below may reference images not displayed]

FINDINGS: MRI HEAD

Brain: There is no acute infarction or intracranial hemorrhage.
There is no intracranial mass, mass effect, or edema. There is no
hydrocephalus or extra-axial fluid collection. Ventricles and sulci
are normal in size and configuration. A punctate focus of T2
hyperintensity in the left frontal white matter likely reflects
nonspecific gliosis/demyelination of doubtful significance.

Vascular: Major vessel flow voids at the skull base are preserved.

Skull and upper cervical spine: Normal marrow signal is preserved.

Sinuses/Orbits: Paranasal sinuses are aerated. Orbits are
unremarkable.

Other: Sella is unremarkable.  Mastoid air cells are clear.

MRA HEAD

Intracranial internal carotid arteries are patent. Middle and
anterior cerebral arteries are patent. Intracranial vertebral
arteries, basilar artery, posterior cerebral arteries are patent.
Left posterior communicating artery is present. There is no
significant stenosis or aneurysm.

MRA NECK

Common, internal, and external carotid arteries are patent.
Codominant vertebral arteries are patent. No hemodynamically
significant stenosis or evidence of dissection.

MRV HEAD

Superior sagittal sinus, straight sinus, vein of COSME, and both
internal cerebral veins are patent. Transverse and sigmoid sinuses
are patent. No evidence of dural sinus thrombosis.
IMPRESSION: No acute or significant intracranial abnormality.

Unremarkable vascular imaging.

## 2022-01-06 IMAGING — MR MR MRA HEAD W/O CM
2 series · 18 of 48 positions shown · IV contrast (gadavist)
Comparison: None

CLINICAL DATA: Neuro deficit, acute, stroke suspected; dural venous
sinus thrombus suspected

EXAM:
MRI HEAD WITHOUT CONTRAST
MRA HEAD WITHOUT CONTRAST
MRA NECK WITHOUT AND WITH CONTRAST
MRV HEAD WITHOUT AND WITH CONTRAST
TECHNIQUE: Multiplanar, multi-echo pulse sequences of the brain and surrounding
structures were acquired without intravenous contrast. Angiographic
images of the Circle of Willis were acquired using MRA technique
without intravenous contrast. Angiographic images of the neck were
acquired using MRA technique without and with intravenous contrast.
Carotid stenosis measurements (when applicable) are obtained
utilizing NASCET criteria, using the distal internal carotid
diameter as the denominator. Angiographic images of the head were
obtained using MRV technique without and with intravenous contrast.
CONTRAST:  10mL GADAVIST GADOBUTROL 1 MMOL/ML IV SOLN

[Series 12: ax (id) · axial · 1.0mm · 0.43mm/px · z∈[-45,+61]mm · 16 of 216 slices shown]
[im 1/216]
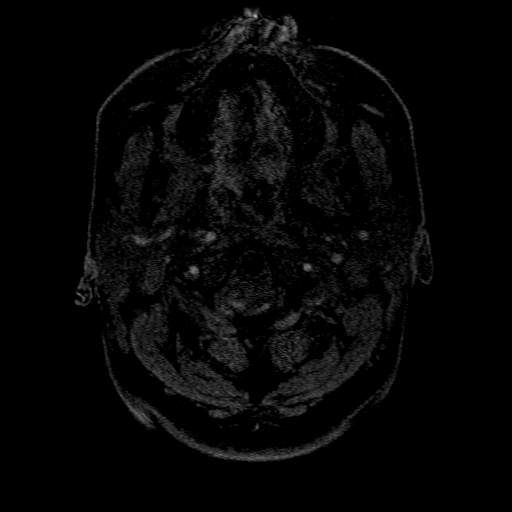
[im 5/216]
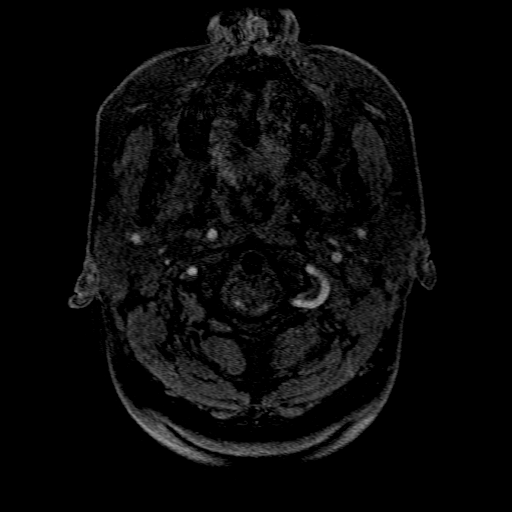
[im 10/216]
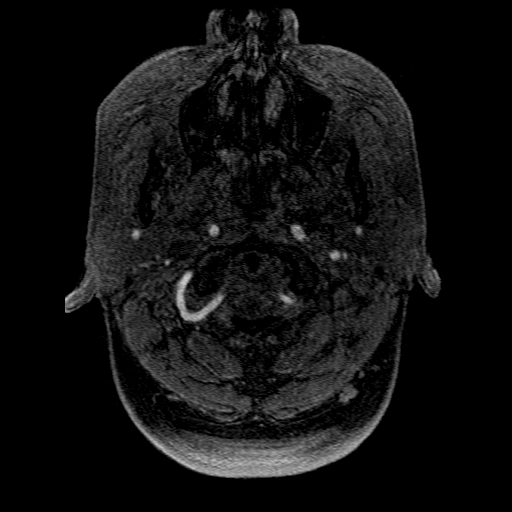
[im 15/216]
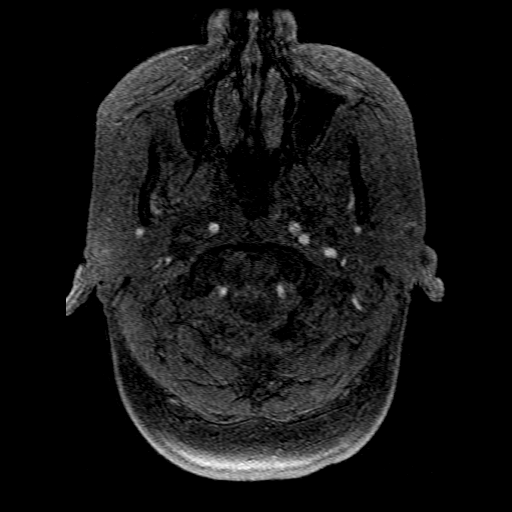
[im 20/216]
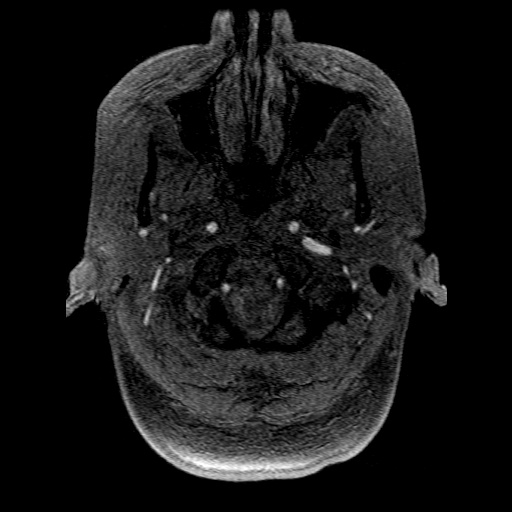
[im 24/216]
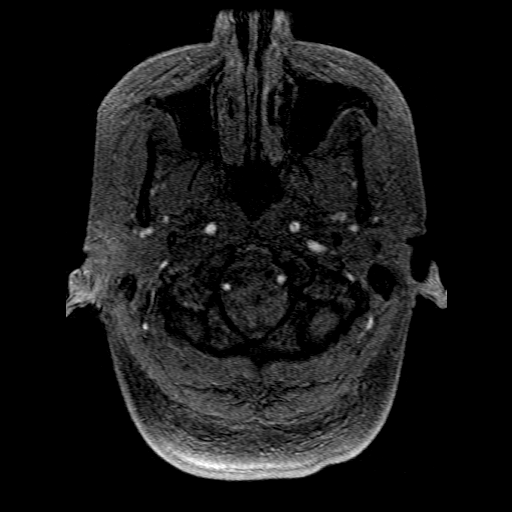
[im 34/216]
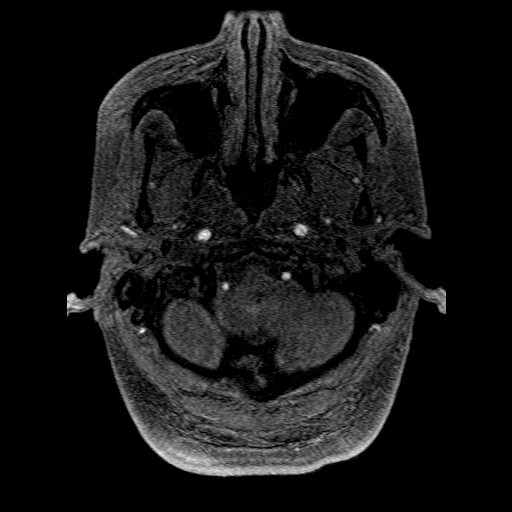
[im 39/216]
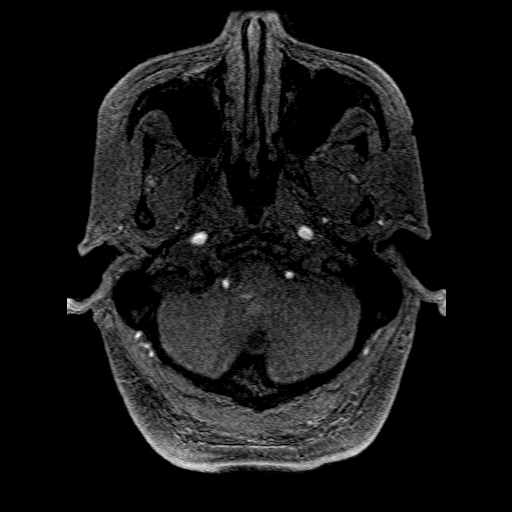
[im 67/216]
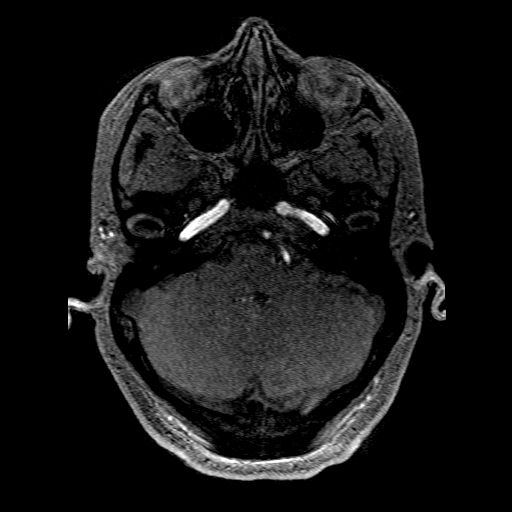
[im 96/216]
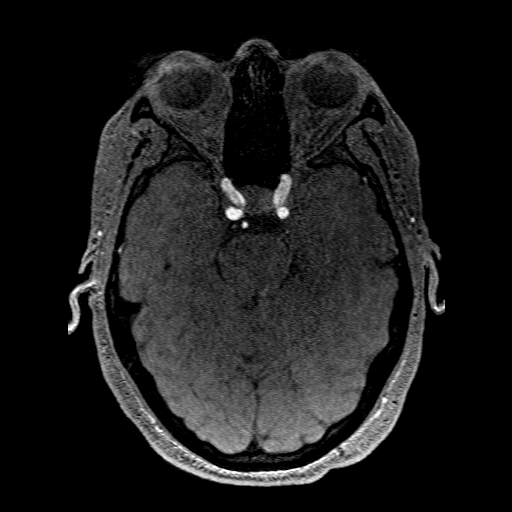
[im 110/216]
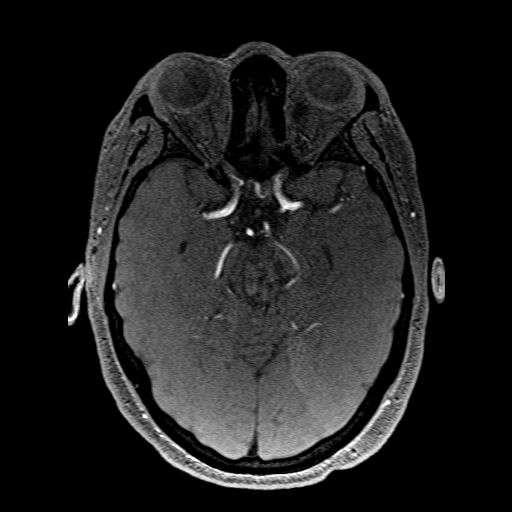
[im 120/216]
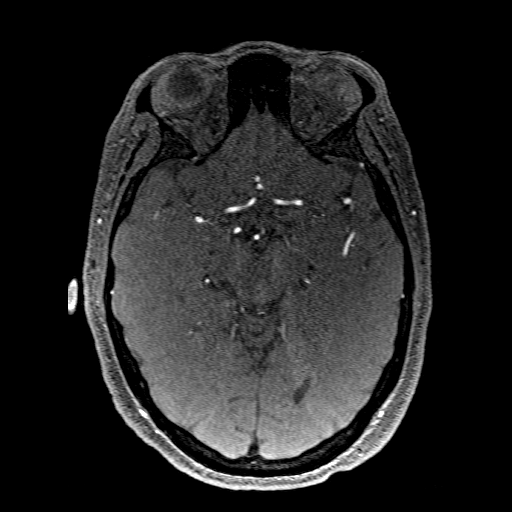
[im 149/216]
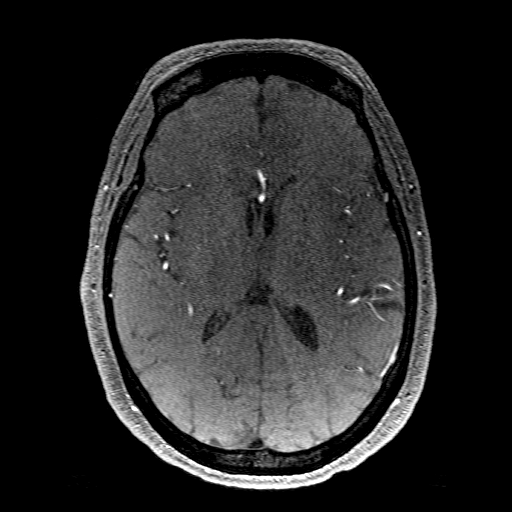
[im 177/216]
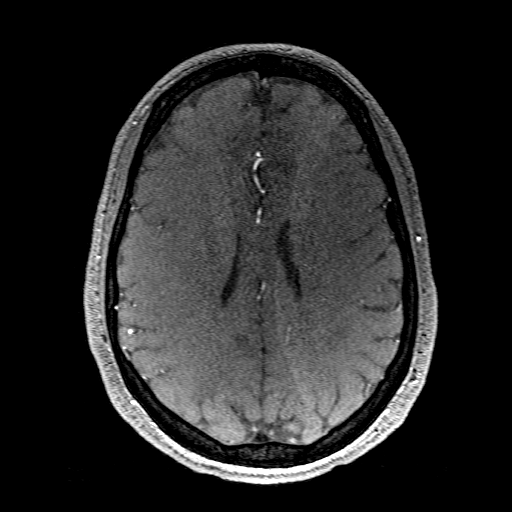
[im 182/216]
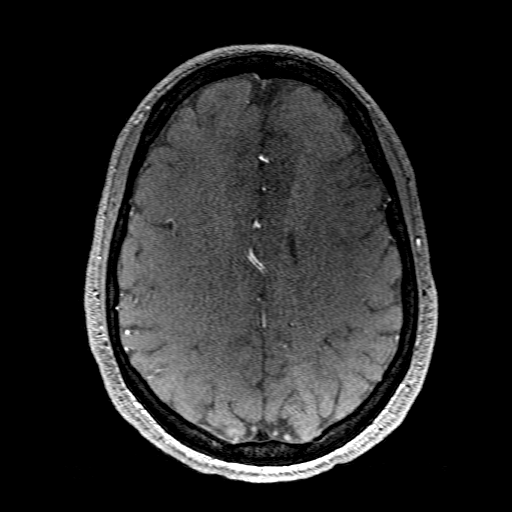
[im 206/216]
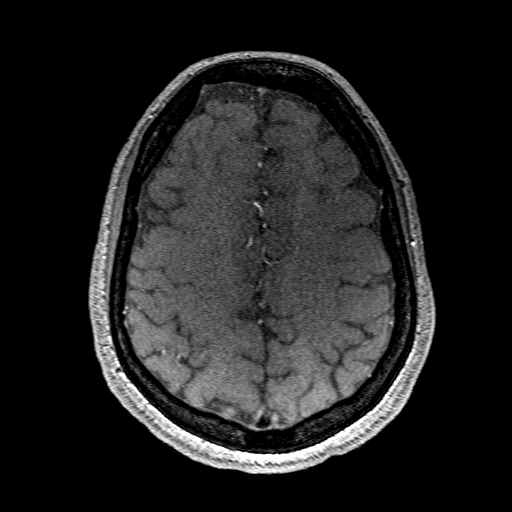

[Series 1201: pjn:ax (id) · sagittal · 1.0mm · 0.43mm/px · 2 of 11 slices shown]
[im 1/11]
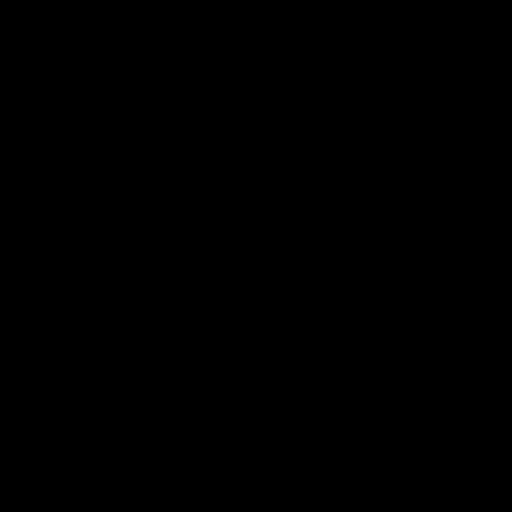
[im 11/11]
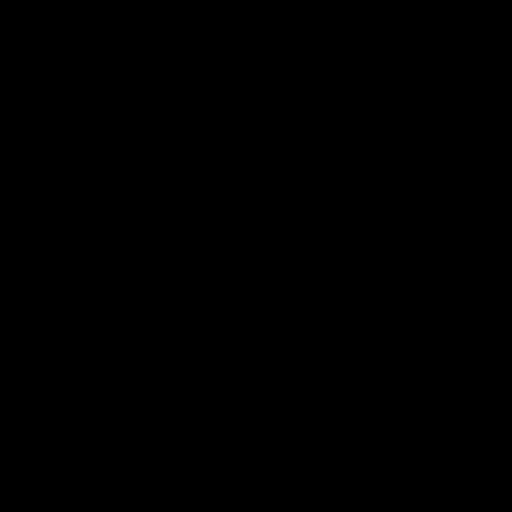

[18 of 48 positions shown; findings below may reference images not displayed]

FINDINGS: MRI HEAD

Brain: There is no acute infarction or intracranial hemorrhage.
There is no intracranial mass, mass effect, or edema. There is no
hydrocephalus or extra-axial fluid collection. Ventricles and sulci
are normal in size and configuration. A punctate focus of T2
hyperintensity in the left frontal white matter likely reflects
nonspecific gliosis/demyelination of doubtful significance.

Vascular: Major vessel flow voids at the skull base are preserved.

Skull and upper cervical spine: Normal marrow signal is preserved.

Sinuses/Orbits: Paranasal sinuses are aerated. Orbits are
unremarkable.

Other: Sella is unremarkable.  Mastoid air cells are clear.

MRA HEAD

Intracranial internal carotid arteries are patent. Middle and
anterior cerebral arteries are patent. Intracranial vertebral
arteries, basilar artery, posterior cerebral arteries are patent.
Left posterior communicating artery is present. There is no
significant stenosis or aneurysm.

MRA NECK

Common, internal, and external carotid arteries are patent.
Codominant vertebral arteries are patent. No hemodynamically
significant stenosis or evidence of dissection.

MRV HEAD

Superior sagittal sinus, straight sinus, vein of COSME, and both
internal cerebral veins are patent. Transverse and sigmoid sinuses
are patent. No evidence of dural sinus thrombosis.
IMPRESSION: No acute or significant intracranial abnormality.

Unremarkable vascular imaging.

## 2022-01-06 MED ORDER — SODIUM CHLORIDE 0.9 % IV SOLN: INTRAVENOUS | Status: DC

## 2022-01-06 MED ORDER — PANTOPRAZOLE SODIUM 40 MG PO TBEC
40.0000 mg | DELAYED_RELEASE_TABLET | Freq: Every day | ORAL | Status: DC
Start: 2022-01-06 — End: 2022-01-07
  Administered 2022-01-06 – 2022-01-07 (×2): 40 mg via ORAL
  Filled 2022-01-06 (×2): qty 1

## 2022-01-06 MED ORDER — ACETAMINOPHEN 325 MG PO TABS
650.0000 mg | ORAL_TABLET | ORAL | Status: DC | PRN
  Administered 2022-01-06 – 2022-01-07 (×2): 650 mg via ORAL
  Filled 2022-01-06 (×2): qty 2

## 2022-01-06 MED ORDER — MONTELUKAST SODIUM 10 MG PO TABS: 10.0000 mg | ORAL_TABLET | Freq: Every evening | ORAL | Status: DC | PRN

## 2022-01-06 MED ORDER — CLOTRIMAZOLE 1 % EX CREA: TOPICAL_CREAM | Freq: Two times a day (BID) | CUTANEOUS | Status: DC | PRN

## 2022-01-06 MED ORDER — STROKE: EARLY STAGES OF RECOVERY BOOK
Freq: Once | Status: AC
  Filled 2022-01-06: qty 1

## 2022-01-06 MED ORDER — LORAZEPAM 2 MG/ML IJ SOLN
1.0000 mg | Freq: Once | INTRAMUSCULAR | Status: AC
  Administered 2022-01-06: 1 mg via INTRAVENOUS

## 2022-01-06 MED ORDER — TETRACAINE HCL 0.5 % OP SOLN
1.0000 [drp] | Freq: Once | OPHTHALMIC | Status: AC
  Administered 2022-01-06: 1 [drp] via OPHTHALMIC
  Filled 2022-01-06: qty 4

## 2022-01-06 MED ORDER — ASPIRIN 81 MG PO CHEW
324.0000 mg | CHEWABLE_TABLET | Freq: Once | ORAL | Status: AC
Start: 2022-01-06 — End: 2022-01-06
  Administered 2022-01-06: 324 mg via ORAL

## 2022-01-06 MED ORDER — LORAZEPAM 2 MG/ML IJ SOLN
INTRAMUSCULAR | Status: AC
  Filled 2022-01-06: qty 1

## 2022-01-06 MED ORDER — ENOXAPARIN SODIUM 40 MG/0.4ML IJ SOSY
40.0000 mg | PREFILLED_SYRINGE | INTRAMUSCULAR | Status: DC
  Administered 2022-01-06 – 2022-01-07 (×2): 40 mg via SUBCUTANEOUS
  Filled 2022-01-06 (×2): qty 0.4

## 2022-01-06 MED ORDER — ACETAMINOPHEN 160 MG/5ML PO SOLN: 650.0000 mg | ORAL | Status: DC | PRN

## 2022-01-06 MED ORDER — ACETAMINOPHEN 650 MG RE SUPP: 650.0000 mg | RECTAL | Status: DC | PRN

## 2022-01-06 MED ORDER — GADOBUTROL 1 MMOL/ML IV SOLN
10.0000 mL | Freq: Once | INTRAVENOUS | Status: AC | PRN
  Administered 2022-01-06: 10 mL via INTRAVENOUS

## 2022-01-06 MED ORDER — NYSTATIN 100000 UNIT/GM EX POWD: 1.0000 "application " | Freq: Two times a day (BID) | CUTANEOUS | Status: DC | PRN

## 2022-01-06 NOTE — ED Triage Notes (Signed)
Patient arrived via POV c/o blurred vision when she woke up 20 minutes pta. Patient states blurred vision in right eye. Patient is AO x 4, VS w/ elevated BP, slow gait. ?

## 2022-01-06 NOTE — H&P (Addendum)
?History and Physical  ? ? ?Patient: Martha Yang XBM:[id number] DOB: [birth year] ?DOA: 01/06/2022 ?DOS: the patient was seen and examined on 01/06/2022 ?PCP: Pcp, No  ?Patient coming from: Home ? ?Chief Complaint:  ?Chief Complaint  ?Patient presents with  ? stroke like symptoms  ? ?HPI: [name] is a 46 y.o. female with medical history significant of diabetes mellitus and acid reflux who presents with presents with complaints of right eye vision loss.  History is obtained with use of Spanish interpreter.  Patient reports that she had a mild headache and went to bed around 11:50 PM last night.  She was awakened out of her sleep in the middle of the night feeling short of breath.  When she opened her eyes she realized she was unable to see out of the right eye.  At that time she started panicking.  She states that her right eye was all red.  She still has pain that feels heavy.  Patient works at [employer] on [address] and wonders if that has something to do with that.  Other associated symptoms included complaints of intermittent palpitations, numbness right side of her face, headache, and reports of weakness on the right arm. ? ? ?On admission to the emergency department patient was seen to be afebrile with blood pressure 104/54 to 158/106, and all other vital signs maintained.  Labs significant for hemoglobin 11.1 with MCV 72.1, MCH 22.8, and all other labs relatively within normal limits.  Alcohol level was undetectable.  CT scan of the head did not note any acute abnormality.  UDS was negative. Influenza and COVID-19 screening negative.  Urinalysis did not note significant signs of infection.  Patient had eye exam which did not note elevated eye pressure.  Neurology had been consulted and recommended obtaining MRI imaging.  Patient had been given 324 mg of aspirin. ?Review of Systems: As mentioned in the history of present illness. All other systems reviewed and are negative. ?Past Medical  History:  ?Diagnosis Date  ? Acid reflux   ? Diabetes mellitus without complication ([place])   ? ?Past Surgical History:  ?Procedure Laterality Date  ? CESAREAN SECTION    ? ?Social History:  reports that she has never smoked. She has never used smokeless tobacco. She reports current alcohol use. She reports that she does not use drugs. ? ?No Known Allergies ? ?No family history on file. ? ?Prior to Admission medications   ?Not on File  ? ? ?Physical Exam: ?Vitals:  ? 01/06/22 0430 01/06/22 0500 01/06/22 0800 01/06/22 0852  ?BP: (!) 158/106 133/87 (!) 154/103 (!) 104/54  ?Pulse: 81 80 80 72  ?Resp: _0 ?Temp:   98.2 ?F (36.8 ?C) 98 ?F (36.7 ?C)  ?TempSrc:   Oral Oral  ?SpO2: 99% 93% 100% 99%  ?Weight:      ? ?Exam ? ?Constitutional: Middle-aged obese female currently in no acute ?Eyes: PERRL, lids and conjunctivae normal.    ?ENMT: Mucous membranes are moist. Posterior pharynx clear of any exudate or lesions.   ?Neck: normal, supple, no masses ?Respiratory: clear to auscultation bilaterally, no wheezing, no crackles. Normal respiratory effort. No accessory muscle use.  ?Cardiovascular: Regular rate and rhythm, no murmurs / rubs / gallops. No extremity edema. 2+ pedal pulses.   ?Abdomen: no tenderness, no masses palpated. Bowel sounds positive.  ?Musculoskeletal: no clubbing / cyanosis. No joint deformity upper and lower extremities. Good ROM, no contractures. Normal muscle tone.  ?Skin: no  rashes, lesions, ulcers. No induration ?Neurologic: CN 2-12 grossly intact.  Strength 5/5 in all extremities.  Speech is clear ?Psychiatric: Normal judgment and insight. Alert and oriented x 3.  Anxious mood.  ? ?Data Reviewed: ? ?EKG reveals sinus rhythm with left atrial enlargement. ? ?Assessment and Plan: ?Suspected TIA ?Acute.  Patient presents with complaints of right eye vision loss this morning with right facial and arm numbness/weakness.  Intraocular pressures were tested by the ED provider.  CT scan of the brain  without any acute abnormality.  Patient still reports having pain behind the right eye and feeling heavy.  UDS negative. ?-Admit to a telemetry bed ?-Neurochecks ?-Check ESR, CRP, TSH, RPR ?-Check lipid panel and hemoglobin A1c ?-Follow-up MRI brain, MRV head, and MRA of the head and neck ?-Allow for permissive hypertension ?-Follow-up telemetry overnight ?-Neurology consulted follow-up for any further recommendations ? ?Microcytic hypochromic anemia ?Hemoglobin 11.1 g/dL with low MCV and MCH on admission.  Suspect likely secondary to iron deficiency. ?-Check iron studies ? ?Diabetes mellitus type 2, well controlled ?On admission glucose 113.  Home medication regimen includes metformin daily. ?-Hypoglycemic protocol ?-Check hemoglobin A1c (6.2) ?-CBGs before every meal with sensitive SSI ? ?Obesity ?-Check height and weight ? ?Advance Care Planning:   Code Status: Full Code  ? ?Consults: Neurology ? ?Family Communication: ? ? ?Severity of Illness: ?The appropriate patient status for this patient is OBSERVATION. Observation status is judged to be reasonable and necessary in order to provide the required intensity of service to ensure the patient's safety. The patient's presenting symptoms, physical exam findings, and initial radiographic and laboratory data in the context of their medical condition is felt to place them at decreased risk for further clinical deterioration. Furthermore, it is anticipated that the patient will be medically stable for discharge from the hospital within 2 midnights of admission.  ? ?Author: ?Norval Morton, MD ?01/06/2022 9:55 AM ? ?For on call review [email or web address].  ?

## 2022-01-06 NOTE — Consult Note (Signed)
?[facility] ? ?Stat Consult ? ?Patient Name:   Martha Yang, Martha Yang ?Date of Birth:   08-02-1976 ?Identification Number:   MRN - [id number] ?Date of Service:   01/06/2022 02:15:37 ? ?Diagnosis: ?      G44.201 - Headache (if no chronic) ?      H57.11 - Right Eye Pain ?      R20.2 - Paresthesia of skin ? ?Impression ?46yo woman with history of DM presenting with right sided headache, visual disturbance, and right sided numbness. Symptoms resolved aside from discomfort with the right eye thus no indication for thrombolytics. Symptoms could represent complex migraine, acute glaucoma in the right eye or other acute intraocular pathology such as uveitis, or potentially TIA given the numbness in the right hand and face reported. Would recommend checking eye pressures with tonopen, ophthalmology consultation, MRI brain w/o, MRA head/neck, MR venogram head w/o. May start aspirin for now. Can try IV migraine cocktail with benadryl, compazine or reglan, and mag sulfate but the pain seems localized to the right eye and these may not help - would defer to ophthalmology for management of eye pain and redness. ? ? ?Recommendations: ?Our recommendations are outlined below. ? ?Diagnostic Studies: ?MRI Head without contrast ?Please order ?MRA head without contrast ?Please order ?MRA neck with contrast ?Please order ? ?Additional Diagnostic Studies: ?MR venogram head /wo ? ? ?---------------------------------------------------------------------------------------------------- ? ? ? ?Metrics: ?[facility] Notification Time: 01/06/2022 02:15:37 ?Stamp Time: 01/06/2022 02:15:37 ?Callback Response Time: 01/06/2022 02:16:11 ? ? ? ? ?---------------------------------------------------------------------------------------------------- ? ?Chief Complaint: ?right sided vision loss and weakness ? ?History of Present Illness: ?Patient is a 46 year old Female. ?46yo woman with history of DM presenting with right  sided headache, visual disturbance, and right sided numbness. She reports she went to bed with a mild headache at around 1150pm this evening. She woke up at 1am 'gasping for air' and noted she could not see out of her right eye. She also noted numbness in the right hand and face at that time. By the time she arrived in the ED these symptoms had resolved. She denies any numbness presently. She reports her vision in the right eye is back to normal. She still has pain in the right eye but denies headache. ? ? ?Past Medical History: ?     Diabetes Mellitus ? ?Medications: ? ?No Anticoagulant use  ?No Antiplatelet use ?Reviewed EMR for current medications ? ?Allergies:  ?Reviewed ? ?Social History: ?Drug Use: No ? ?Family History: ? ?There is no family history of premature cerebrovascular disease pertinent to this consultation ? ?ROS : ?14 Points Review of Systems was performed and was negative except mentioned in HPI. ? ?Past Surgical History: ?There Is No Surgical History Contributory To Today?s Visit ? ? ?Examination: ?BP(129/69), Pulse(86), ?1A: Level of Consciousness - Alert; keenly responsive + 0 ?1B: Ask Month and Age - Both Questions Right + 0 ?1C: Blink Eyes & Squeeze Hands - Performs Both Tasks + 0 ?2: Test Horizontal Extraocular Movements - Normal + 0 ?3: Test Visual Fields - No Visual Loss + 0 ?4: Test Facial Palsy (Use Grimace if Obtunded) - Normal symmetry + 0 ?5A: Test Left Arm Motor Drift - No Drift for 10 Seconds + 0 ?5B: Test Right Arm Motor Drift - No Drift for 10 Seconds + 0 ?6A: Test Left Leg Motor Drift - No Drift for 5 Seconds + 0 ?6B: Test Right Leg Motor Drift - No Drift for 5 Seconds + 0 ?7: Test Limb Ataxia (  FNF/Heel-Shin) - No Ataxia + 0 ?8: Test Sensation - Normal; No sensory loss + 0 ?9: Test Language/Aphasia - Normal; No aphasia + 0 ?10: Test Dysarthria - Normal + 0 ?11: Test Extinction/Inattention - No abnormality + 0 ? ?NIHSS Score: 0 ?NIHSS Free Text : grip strength is equal per RN,  speech and naming normal via spanish interpreter without dysarthria, VF normal including in right eye alone, touched right eye a couple of times and seemed to have some discomfort there still ? ? ? ? ?Patient / Family was informed the Neurology Consult would occur via TeleHealth consult by way of interactive audio and video telecommunications and consented to receiving care in this manner. ? ?Patient is being evaluated for possible acute neurologic impairment and high probability of imminent or life - threatening deterioration.I spent total of 35 minutes providing care to this patient, including time for face to face visit via telemedicine, review of medical records, imaging studies and discussion of findings with providers, the patient and / or family. ? ? ?Dr Annamary Carolin ? ? ?[facility] ?[phone] ? ?Case [id number] ? ?

## 2022-01-06 NOTE — Consult Note (Signed)
NEUROLOGY CONSULTATION NOTE   Date of service: January 06, 2022 Patient Name: Martha Yang MRN:  [id number] DOB:  1976-02-16 Reason for consult: "R eye vision loss" Requesting Provider: Clydie Braun, MD _ _ _   _ __   _ __ _ _  __ __   _ __   __ _  History of Present Illness  Martha Yang is a 46 y.o. female with PMH significant for GERD, DM2, who presents with R eye vision loss. Reports she woke up an hour after going to bed and the R vision was dark. She could not see anything out of it. This slowly recovered over an hour and 20 mins. Also reports some mild R facial numbness and tingling. Reports she had a headache but it was mild, the back of her head hurt. Her eye felt heavy but no eye pain. No floaters, no flashes, no curtain falling, felt her eye was red. It was all Right eye and not hemianopsia.  LKW: 2340 on 01/05/22. mRS: 0 tNKASE: not offered, resolution of symptoms. Thrombectomy: not offered, low suspicion for LVO NIHSS components Score: Comment  1a Level of Conscious 0[x]  1[]  2[]  3[]      1b LOC Questions 0[x]  1[]  2[]       1c LOC Commands 0[x]  1[]  2[]       2 Best Gaze 0[x]  1[]  2[]       3 Visual 0[x]  1[]  2[]  3[]      4 Facial Palsy 0[x]  1[]  2[]  3[]      5a Motor Arm - left 0[x]  1[]  2[]  3[]  4[]  UN[]    5b Motor Arm - Right 0[x]  1[]  2[]  3[]  4[]  UN[]    6a Motor Leg - Left 0[x]  1[]  2[]  3[]  4[]  UN[]    6b Motor Leg - Right 0[x]  1[]  2[]  3[]  4[]  UN[]    7 Limb Ataxia 0[x]  1[]  2[]  3[]  UN[]     8 Sensory 0[x]  1[]  2[]  UN[]      9 Best Language 0[x]  1[]  2[]  3[]      10 Dysarthria 0[x]  1[]  2[]  UN[]      11 Extinct. and Inattention 0[x]  1[]  2[]       TOTAL: 0     ROS   Constitutional Denies weight loss, fever and chills.   HEENT No current changes in vision and hearing.   Respiratory Denies SOB and cough.   CV Denies palpitations and CP   GI Denies abdominal pain, nausea, vomiting and diarrhea.   GU Denies dysuria and urinary frequency.   MSK Denies myalgia and joint  pain.   Skin Denies rash and pruritus.   Neurological Denies headache and syncope.   Psychiatric Denies recent changes in mood. Denies anxiety and depression.    Past History   Past Medical History:  Diagnosis Date   Acid reflux    Diabetes mellitus without complication (HCC)    Past Surgical History:  Procedure Laterality Date   CESAREAN SECTION     No family history on file. Social History   Socioeconomic History   Marital status: Single    Spouse name: Not on file   Number of children: Not on file   Years of education: Not on file   Highest education level: Not on file  Occupational History   Not on file  Tobacco Use   Smoking status: Never   Smokeless tobacco: Never  Substance and Sexual Activity   Alcohol use: Yes    Comment: socially   Drug use: Never   Sexual activity: Not on file  Other Topics Concern   Not on file  Social History Narrative   Not on file   Social Determinants of Health   Financial Resource Strain: Not on file  Food Insecurity: Not on file  Transportation Needs: Not on file  Physical Activity: Not on file  Stress: Not on file  Social Connections: Not on file   No Known Allergies  Medications   Medications Prior to Admission  Medication Sig Dispense Refill Last Dose   albuterol (VENTOLIN HFA) 108 (90 Base) MCG/ACT inhaler Inhale 1-2 puffs into the lungs every 6 (six) hours as needed for wheezing or shortness of breath.   unk   clotrimazole-betamethasone (LOTRISONE) cream Apply 1 application. topically 2 (two) times daily as needed for rash.   Past Week   ibuprofen (ADVIL) 200 MG tablet Take 600 mg by mouth every 6 (six) hours as needed.   01/05/2022   ibuprofen (ADVIL) 800 MG tablet Take 800 mg by mouth 2 (two) times daily as needed for pain.   unk   Magnesium 500 MG TABS Take 500 mg by mouth daily.   01/03/2022   montelukast (SINGULAIR) 10 MG tablet Take 10 mg by mouth at bedtime as needed (allergy symptoms).      nystatin  (MYCOSTATIN/NYSTOP) powder Apply 1 application. topically 2 (two) times daily as needed (rash).   Past Month   pantoprazole (PROTONIX) 40 MG tablet Take 40 mg by mouth daily.   01/05/2022   metFORMIN (GLUCOPHAGE-XR) 500 MG 24 hr tablet Take 500 mg by mouth daily with breakfast. (Patient not taking: Reported on 01/06/2022)   Not Taking     Vitals   Vitals:   01/06/22 0430 01/06/22 0500 01/06/22 0800 01/06/22 0852  BP: (!) 158/106 133/87 (!) 154/103 (!) 104/54  Pulse: 81 80 80 72  Resp: 20 20 18 16   Temp:   98.2 F (36.8 C) 98 F (36.7 C)  TempSrc:   Oral Oral  SpO2: 99% 93% 100% 99%  Weight:         There is no height or weight on file to calculate BMI.  Physical Exam   General: Laying comfortably in bed; in no acute distress.  HENT: Normal oropharynx and mucosa. Normal external appearance of ears and nose.  Neck: Supple, no pain or tenderness  CV: No JVD. No peripheral edema.  Pulmonary: Symmetric Chest rise. Normal respiratory effort.  Abdomen: Soft to touch, non-tender.  Ext: No cyanosis, edema, or deformity  Skin: No rash. Normal palpation of skin.   Musculoskeletal: Normal digits and nails by inspection. No clubbing.   Neurologic Examination  Mental status/Cognition: Alert, oriented to self, place, month and year, good attention. Speech/language: Fluent, comprehension intact, object naming intact, repetition intact.  Cranial nerves:   CN II Pupils equal and reactive to light, no VF deficits    CN III,IV,VI EOM intact, no gaze preference or deviation, no nystagmus    CN V normal sensation in V1, V2, and V3 segments bilaterally    CN VII no asymmetry, no nasolabial fold flattening   CN VIII normal hearing to speech    CN IX & X normal palatal elevation, no uvular deviation    CN XI 5/5 head turn and 5/5 shoulder shrug bilaterally    CN XII midline tongue protrusion    Motor:  Muscle bulk: normal, tone normal, pronator drift none tremor none Mvmt Root Nerve  Muscle  Right Left Comments  SA C5/6 Ax Deltoid 5 5   EF C5/6 Mc  Biceps 5 5   EE C6/7/8 Rad Triceps 5 5   WF C6/7 Med FCR     WE C7/8 PIN ECU     F Ab C8/T1 U ADM/FDI 5 5   HF L1/2/3 Fem Illopsoas 5 5   KE L2/3/4 Fem Quad 5 5   DF L4/5 D Peron Tib Ant 5 5   PF S1/2 Tibial Grc/Sol 5 5    Reflexes:  Right Left Comments  Pectoralis      Biceps (C5/6) 1 1   Brachioradialis (C5/6) 1 1    Triceps (C6/7) 1 1    Patellar (L3/4) 1 1    Achilles (S1)      Hoffman      Plantar     Jaw jerk    Sensation:  Light touch Intact throughout   Pin prick    Temperature    Vibration   Proprioception    Coordination/Complex Motor:  - Finger to Nose intact BL - Heel to shin intact BL - Rapid alternating movement are normal - Gait: deferred.  Labs   CBC:  Recent Labs  Lab 01/06/22 0133  WBC 8.3  NEUTROABS 4.7  HGB 11.1*  HCT 35.1*  MCV 72.1*  PLT 333    Basic Metabolic Panel:  Lab Results  Component Value Date   NA 135 01/06/2022   K 3.8 01/06/2022   CO2 25 01/06/2022   GLUCOSE 113 (H) 01/06/2022   BUN 13 01/06/2022   CREATININE 0.58 01/06/2022   CALCIUM 9.2 01/06/2022   GFRNONAA >60 01/06/2022   Lipid Panel:  Lab Results  Component Value Date   LDLCALC 133 (H) 01/06/2022   HgbA1c:  Lab Results  Component Value Date   HGBA1C 6.2 (H) 01/06/2022   Urine Drug Screen:     Component Value Date/Time   LABOPIA NEGATIVE (A) 01/06/2022 0321   COCAINSCRNUR NEGATIVE (A) 01/06/2022 0321   LABBENZ NEGATIVE (A) 01/06/2022 0321   AMPHETMU NEGATIVE (A) 01/06/2022 0321   THCU NEGATIVE (A) 01/06/2022 0321   LABBARB NEGATIVE (A) 01/06/2022 0321    Alcohol Level     Component Value Date/Time   ETH <10 01/06/2022 0144    CT Head without contrast(Personally reviewed): CTH was negative for a large hypodensity concerning for a large territory infarct or hyperdensity concerning for an ICH  MR Angio head without contrast and MR angio neck with and without contrast(Personally  reviewed): No LVO, no significant stenosis.  MRI Brain(Personally reviewed): No acute strokes.  MR Venogram head(Personally reviewed): No dural sinus venous thrombosis.  Impression   Srihitha Laddie Lamere is a 46 y.o. female with PMH significant for with PMH significant for GERD, DM2, who presents with R eye vision loss with some R facial numbness and headache.  Given the focal nature of her symptoms, stroke/TIA is on the differential however would be hard to localize loss of vision along with R facial numbness to a single lesion in the brain, specially since she is reporting this was more of a mono-occular vision loss rather than hemianopsia which would localize this anterior to the optic chiasm. Symptoms could be due to complex migraine, tonometry in the ED with no elevated eye pressure.  Recommendations   - Frequent Neuro checks per stroke unit protocol - Recommend obtaining TTE  - Recommend obtaining Lipid panel with LDL - Please start statin if LDL > 70 - Recommend HbA1c - Antithrombotic - Aspirin 81mg  daily. - Recommend DVT ppx - SBP goal - permissive hypertension first  24 h < 220/110. Held home meds.  - Recommend Telemetry monitoring for arrythmia - Recommend bedside swallow screen prior to PO intake. - Stroke education booklet - Recommend PT/OT/SLP consult ______________________________________________________________________   Thank you for the opportunity to take part in the care of this patient. If you have any further questions, please contact the neurology consultation attending.  Signed,  Erick Blinks [facility] Pager Number [phone] _ _ _   _ __   _ __ _ _  __ __   _ __   __ _

## 2022-01-06 NOTE — ED Notes (Signed)
Pt ambulatory to restroom with independent steady gait °

## 2022-01-06 NOTE — ED Provider Notes (Signed)
?[facility] EMERGENCY DEPARTMENT ?Provider Note ? ? ?CSN: [id number] ?Arrival date & time: 01/06/22  0115 ? ?  ? ?History ? ?Chief Complaint  ?Patient presents with  ? stroke like symptoms  ? ?Level 5 caveat due to acuity of condition ?Martha Seleta RhymesJosefina Yang is a 46 y.o. female. ? ?The history is provided by the patient. The history is limited by the condition of the patient and a language barrier. A language interpreter was used Martha Yang(Martha Yang - [id number]/Martha Yang - [phone] - [employer]).  ?Neurologic Problem ?This is a new problem. The current episode started 1 to 2 hours ago. The problem occurs constantly. The problem has been resolved. Associated symptoms include headaches. Pertinent negatives include no chest pain. Nothing relieves the symptoms.  ?Patient reports she went to bed around 11:40 PM on April 29 ?She woke up in the past hour with right-sided visual changes.  She reports she stood up and felt dizzy and had numbness in her right face.  She also reports she had some weakness in her right arm.  Since that time her symptoms are now improving. ?She had a headache that is improving.  No other pain complaints ?No previous history of stroke ?  ? ?Home Medications ?Prior to Admission medications   ?Not on File  ?   ? ?Allergies    ?Patient has no known allergies.   ? ?Review of Systems   ?Review of Systems  ?Unable to perform ROS: Acuity of condition  ?Eyes:  Positive for visual disturbance.  ?Cardiovascular:  Negative for chest pain.  ?Neurological:  Positive for weakness, numbness and headaches.  ? ?Physical Exam ?Updated Vital Signs ?BP (!) 158/106   Pulse 81   Temp 98 ?F (36.7 ?C)   Resp 20   Wt 109.7 kg   LMP 12/19/2021   SpO2 99%  ?Physical Exam ?CONSTITUTIONAL: Well developed/well nourished ?HEAD: Normocephalic/atraumatic ?EYES: EOMI/PERRL, no nystagmus, no visual ?field deficit  no ptosis, no conjunctival injection, no proptosis, IOP 19 and OD, IOP 18 OS ?ENMT: Mucous membranes moist ?NECK: supple no  meningeal signs ?CV: S1/S2 noted, no murmurs/rubs/gallops noted ?LUNGS: Lungs are clear to auscultation bilaterally, no apparent distress ?ABDOMEN: soft, nontender, no rebound or guarding ?GU:no cva tenderness ?NEURO:Awake/alert, face symmetric, no arm or leg drift is noted ?Equal 5/5 strength with shoulder abduction, elbow flex/extension, wrist flex/extension in upper extremities ?Equal 5/5 strength with hip flexion,knee flex/extension, foot dorsi/plantar flexion ?Cranial nerves 3/4/5/6/03/18/09/11/12 tested and intact ?Sensation to light touch intact in all extremities ?NIHSS=0 ?EXTREMITIES: pulses normal, full ROM ?SKIN: warm, color normal ?PSYCH: no abnormalities of mood noted ? ?ED Results / Procedures / Treatments   ?Labs ?(all labs ordered are listed, but only abnormal results are displayed) ?Labs Reviewed  ?COMPREHENSIVE METABOLIC PANEL - Abnormal; Notable for the following components:  ?    Result Value  ? Glucose, Bld 113 (*)   ? Total Bilirubin 0.2 (*)   ? All other components within normal limits  ?URINALYSIS, ROUTINE W REFLEX MICROSCOPIC - Abnormal; Notable for the following components:  ? Hgb urine dipstick SMALL (*)   ? All other components within normal limits  ?CBC WITH DIFFERENTIAL/PLATELET - Abnormal; Notable for the following components:  ? Hemoglobin 11.1 (*)   ? HCT 35.1 (*)   ? MCV 72.1 (*)   ? MCH 22.8 (*)   ? All other components within normal limits  ?RAPID URINE DRUG SCREEN, HOSP PERFORMED - Abnormal; Notable for the following components:  ? Opiates NEGATIVE (*)   ?  Cocaine NEGATIVE (*)   ? Benzodiazepines NEGATIVE (*)   ? Amphetamines NEGATIVE (*)   ? Tetrahydrocannabinol NEGATIVE (*)   ? Barbiturates NEGATIVE (*)   ? All other components within normal limits  ?URINALYSIS, MICROSCOPIC (REFLEX) - Abnormal; Notable for the following components:  ? Bacteria, UA FEW (*)   ? Non Squamous Epithelial PRESENT (*)   ? All other components within normal limits  ?CBG MONITORING, ED - Abnormal; Notable  for the following components:  ? Glucose-Capillary 109 (*)   ? All other components within normal limits  ?RESP PANEL BY RT-PCR (FLU A&B, COVID) [id number]  ?ETHANOL  ?HCG, QUANTITATIVE, PREGNANCY  ?PROTIME-INR  ?APTT  ? ? ?EKG ?EKG Interpretation ? ?Date/Time:  Saturday January 06 2022 01:34:12 EDT ?Ventricular Rate:  89 ?PR Interval:  158 ?QRS Duration: 88 ?QT Interval:  335 ?QTC Calculation: 408 ?R Axis:   70 ?Text Interpretation: Sinus rhythm Consider left atrial enlargement No previous ECGs available Confirmed by Martha Yang ([id number]) on 01/06/2022 1:45:40 AM ? ?Radiology ?CT HEAD WO CONTRAST ? ?Result Date: 01/06/2022 ?CLINICAL DATA:  Blurred vision. EXAM: CT HEAD WITHOUT CONTRAST TECHNIQUE: Contiguous axial images were obtained from the base of the skull through the vertex without intravenous contrast. RADIATION DOSE REDUCTION: This exam was performed according to the departmental dose-optimization program which includes automated exposure control, adjustment of the mA and/or kV according to patient size and/or use of iterative reconstruction technique. COMPARISON:  None. FINDINGS: Brain: No evidence of acute infarction, hemorrhage, hydrocephalus, extra-axial collection or mass lesion/mass effect. Vascular: No hyperdense vessel or unexpected calcification. Skull: Normal. Negative for fracture or focal lesion. Sinuses/Orbits: No acute finding. Other: None. IMPRESSION: No acute intracranial pathology. Electronically Signed   By: Aram Candela M.D.   On: 01/06/2022 01:59   ? ?Procedures ?Procedures  ? ? ?Medications Ordered in ED ?Medications  ?tetracaine (PONTOCAINE) 0.5 % ophthalmic solution 1 drop (1 drop Both Eyes Given by Other 01/06/22 0317)  ?aspirin chewable tablet 324 mg (324 mg Oral Given 01/06/22 0435)  ? ? ?ED Course/ Medical Decision Making/ A&P ?Clinical Course as of 01/06/22 0500  ?Sat Jan 06, 2022  ?0149 Last known well approximately 2340 on April 28.  Patient now back to baseline.  NIH stroke scale  at 0.  We will proceed with TIA work-up [DW]  ?0209 CT head negative.  Will consult teleneurology [DW]  ?0306 Glucose(!): 113 ?Mild hyperglycemia [DW]  ?0306 Discussed with Dr. Senaida Ores with teleneurology.  Recommends admission for further stroke work-up.  Would also consider primary eye complaint.  On exam there is no signs of glaucoma. [DW]  ?5681 Discussed with Dr. Imogene Burn for admission.  Patient will likely have a prolonged wait, therefore we can obtain MRIs prior to transfer this would help expedite care.  If MRI is available today, they will be performed in the morning.  These have been ordered per neurology recommendation [DW]  ?  ?Clinical Course User Index ?[DW] Martha Rhine, MD  ? ?                        ?Medical Decision Making ?Amount and/or Complexity of Data Reviewed ?Labs: ordered. Decision-making details documented in ED Course. ?Radiology: ordered. ? ?Risk ?OTC drugs. ?Prescription drug management. ?Decision regarding hospitalization. ? ? ?This patient presents to the ED for concern of weakness and vision change, this involves an extensive number of treatment options, and is a complaint that carries with it a high risk of complications  and morbidity.  The differential diagnosis includes but is not limited to intracranial hemorrhage, CVA, subarachnoid hemorrhage, complicated migraine ? ? ?Social Determinants of Health: ?Patient?s  English as a second language   increases the complexity of managing their presentation ? ?Additional history obtained: ?Additional history obtained from family ?No records available ? ?Lab Tests: ?I Ordered, and personally interpreted labs.  The pertinent results include: Hyperglycemia ? ?Imaging Studies ordered: ?I ordered imaging studies including CT scan head   ?I independently visualized and interpreted imaging which showed negative ?I agree with the radiologist interpretation ? ?Cardiac Monitoring: ?The patient was maintained on a cardiac monitor.  I personally  viewed and interpreted the cardiac monitor which showed an underlying rhythm of:  sinus rhythm ? ? ? ?Critical Interventions: ? ?Admission for stroke work-up ? ?Consultations Obtained: ?I requested consultation w

## 2022-01-06 NOTE — Progress Notes (Signed)
  [facility] will assume care on arrival to accepting facility. Until arrival, care as per EDP. However, [facility] available 24/7 for questions and assistance.   Nursing staff please page [facility] Admits and Consults ([phone]) as soon as the patient arrives to the hospital.  Dejon Lukas, DO [facility]  

## 2022-01-06 NOTE — ED Notes (Addendum)
Per [employer] spanish interpreter, pt reports she went to bed last night around 2340 and woke up this morning around 0100 with sudden onset of loss of vision in her right eye and numbness to right hand and right side of face which has since subsided upon speaking with Dr. Bebe Shaggy around 0130. She denies any current numbness, tingling or weakness in any extremities. Pt is speaking in clear complete sentences. Son at bedside. Dr. Bebe Shaggy at bedside upon arrival to ER. No code stroke activation at this time per Dr. Bebe Shaggy but pt taken directly to CT by this RN and will activate teleneuro per request from Dr. Bebe Shaggy. ?

## 2022-01-07 ENCOUNTER — Other Ambulatory Visit (HOSPITAL_COMMUNITY): Payer: Self-pay

## 2022-01-07 ENCOUNTER — Observation Stay (HOSPITAL_COMMUNITY): Payer: BLUE CROSS/BLUE SHIELD

## 2022-01-07 ENCOUNTER — Observation Stay (HOSPITAL_BASED_OUTPATIENT_CLINIC_OR_DEPARTMENT_OTHER): Payer: BLUE CROSS/BLUE SHIELD

## 2022-01-07 DIAGNOSIS — G453 Amaurosis fugax: Secondary | ICD-10-CM | POA: Diagnosis not present

## 2022-01-07 DIAGNOSIS — G43109 Migraine with aura, not intractable, without status migrainosus: Secondary | ICD-10-CM

## 2022-01-07 DIAGNOSIS — E119 Type 2 diabetes mellitus without complications: Secondary | ICD-10-CM | POA: Diagnosis not present

## 2022-01-07 DIAGNOSIS — G459 Transient cerebral ischemic attack, unspecified: Secondary | ICD-10-CM

## 2022-01-07 DIAGNOSIS — D509 Iron deficiency anemia, unspecified: Secondary | ICD-10-CM | POA: Diagnosis not present

## 2022-01-07 LAB — BASIC METABOLIC PANEL
Anion gap: 5 (ref 5–15)
BUN: 9 mg/dL (ref 6–20)
CO2: 23 mmol/L (ref 22–32)
Calcium: 8.9 mg/dL (ref 8.9–10.3)
Chloride: 107 mmol/L (ref 98–111)
Creatinine, Ser: 0.66 mg/dL (ref 0.44–1.00)
GFR, Estimated: >60 mL/min (ref >60)
Glucose, Bld: 130 mg/dL — ABNORMAL HIGH (ref 70–99)
Potassium: 3.7 mmol/L (ref 3.5–5.1)
Sodium: 135 mmol/L (ref 135–145)

## 2022-01-07 LAB — ECHOCARDIOGRAM COMPLETE
AR max vel: 2.21 cm2
AV Area VTI: 2.61 cm2
AV Area mean vel: 2.22 cm2
AV Mean grad: 5 mmHg
AV Peak grad: 11.4 mmHg
Ao pk vel: 1.69 m/s
Area-P 1/2: 2.73 cm2
Calc EF: 56.9 %
Height: 62.992 in
MV VTI: 3.22 cm2
S' Lateral: 2.9 cm
Single Plane A2C EF: 55.7 %
Single Plane A4C EF: 58.6 %
Weight: 3869.51 oz

## 2022-01-07 LAB — CBC
HCT: 33 % — ABNORMAL LOW (ref 36.0–46.0)
Hemoglobin: 10.6 g/dL — ABNORMAL LOW (ref 12.0–15.0)
MCH: 23.3 pg — ABNORMAL LOW (ref 26.0–34.0)
MCHC: 32.1 g/dL (ref 30.0–36.0)
MCV: 72.5 fL — ABNORMAL LOW (ref 80.0–100.0)
Platelets: 307 10*3/uL (ref 150–400)
RBC: 4.55 MIL/uL (ref 3.87–5.11)
RDW: 15.1 % (ref 11.5–15.5)
WBC: 7.7 10*3/uL (ref 4.0–10.5)
nRBC: 0 % (ref 0.0–0.2)

## 2022-01-07 LAB — RPR: RPR Ser Ql: NONREACTIVE

## 2022-01-07 LAB — FERRITIN: Ferritin: 5 ng/mL — ABNORMAL LOW (ref 11–307)

## 2022-01-07 IMAGING — CT CT ANGIO NECK
2 of 6 series · 16 of 46 positions shown, 18 images · IV contrast (APPLIED)
Comparison: MRA head and neck yesterday.

CLINICAL DATA: 46-year-old female with neurologic deficit. Blurred
vision. Unrevealing MRI, MRA, MRV.



[Series 5: cta neck 2.0 i30f 3 · axial · 0.54mm/px · z∈[+1024,+1234]mm · 13 of 117 slices shown, 15 images]
[im 6/117  soft-tissue]
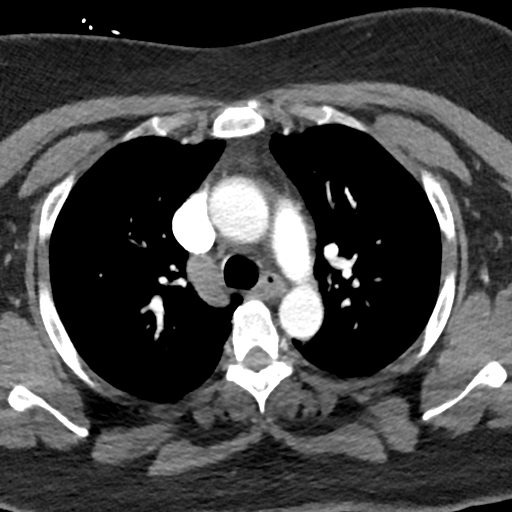
[im 6/117  bone]
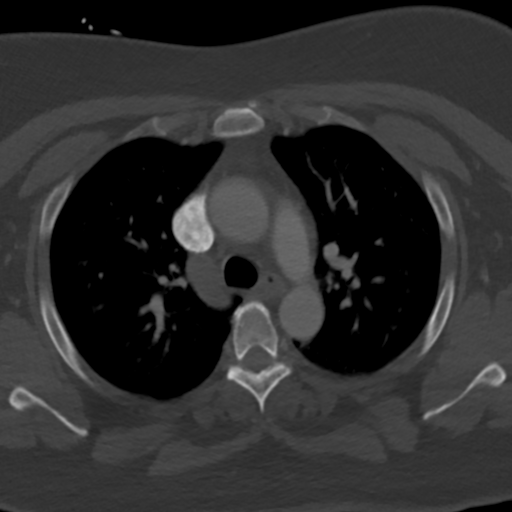
[im 16/117  soft-tissue]
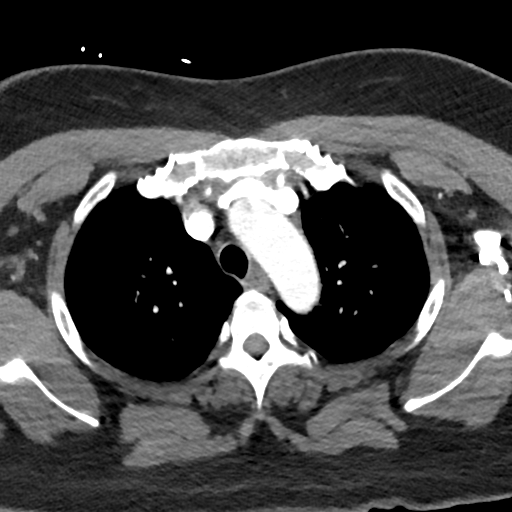
[im 27/117  soft-tissue]
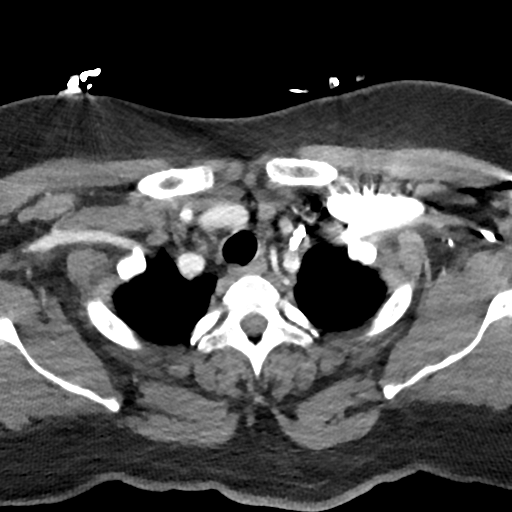
[im 32/117  soft-tissue]
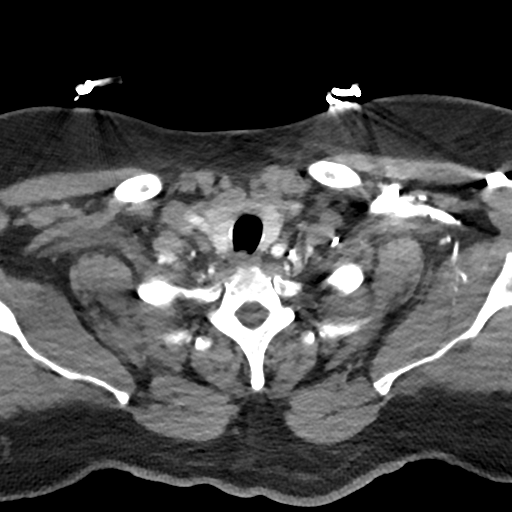
[im 43/117  soft-tissue]
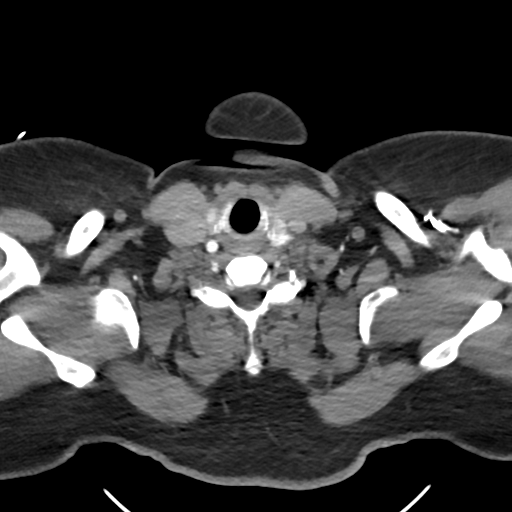
[im 48/117  soft-tissue]
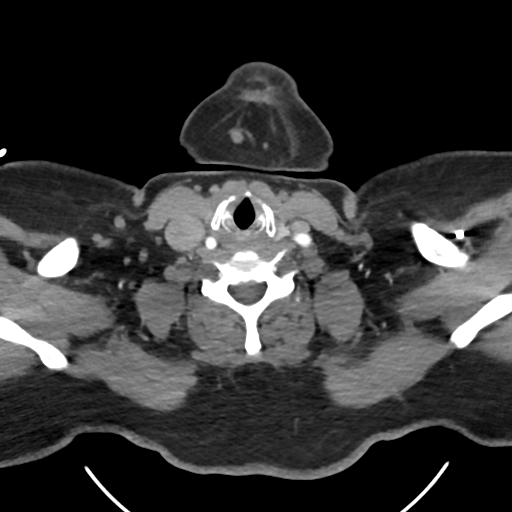
[im 59/117  soft-tissue]
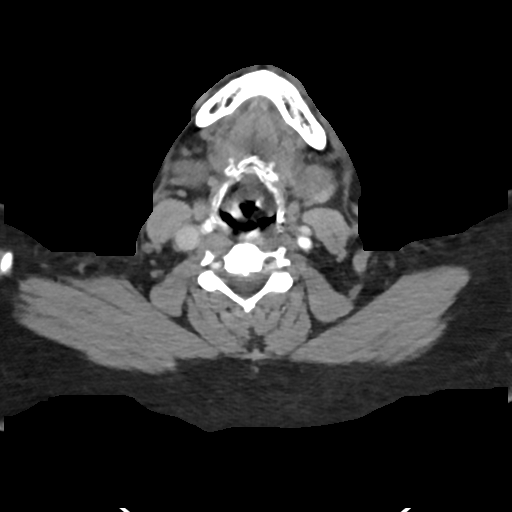
[im 69/117  soft-tissue]
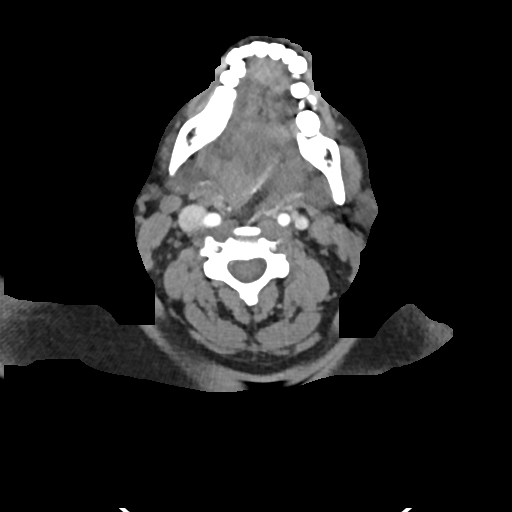
[im 74/117  soft-tissue]
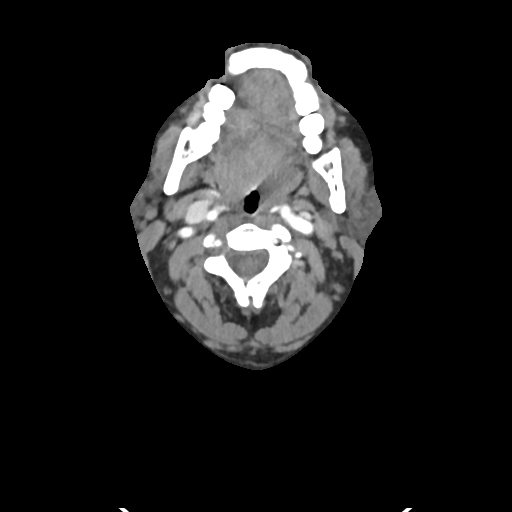
[im 74/117  bone]
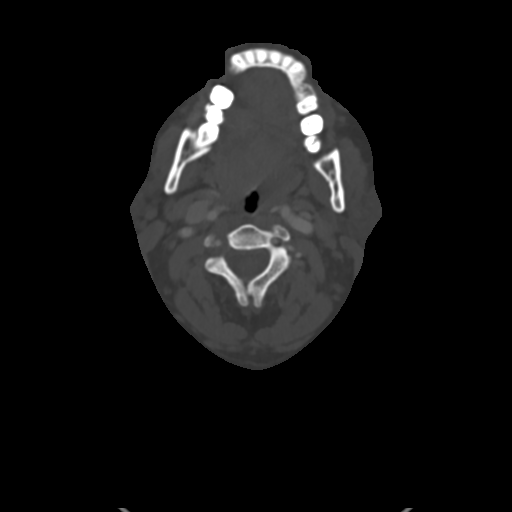
[im 85/117  soft-tissue]
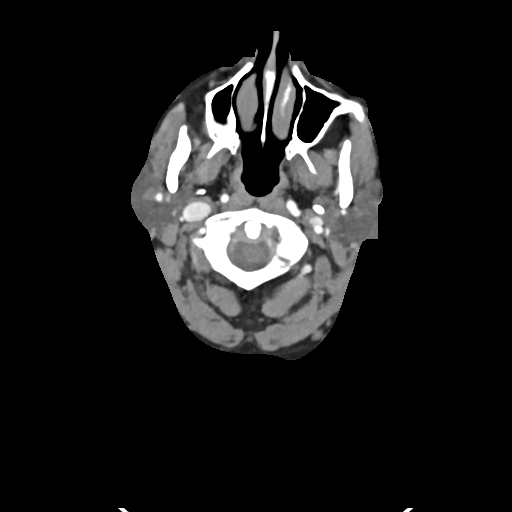
[im 90/117  soft-tissue]
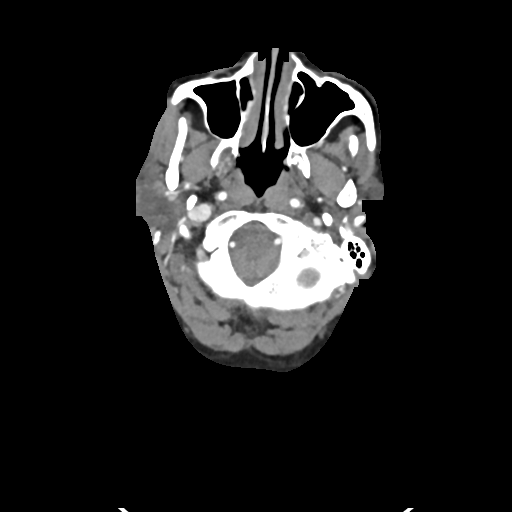
[im 101/117  soft-tissue]
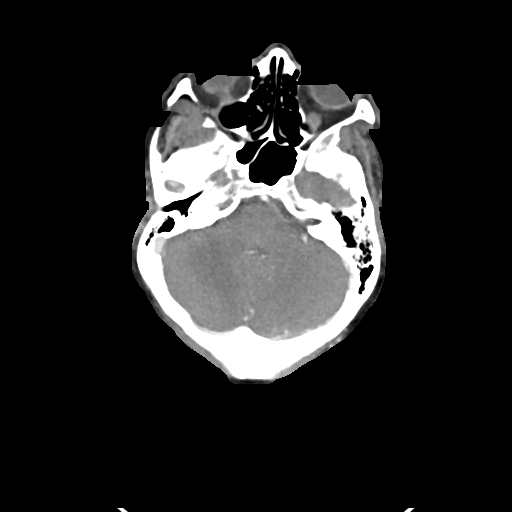
[im 111/117  soft-tissue]
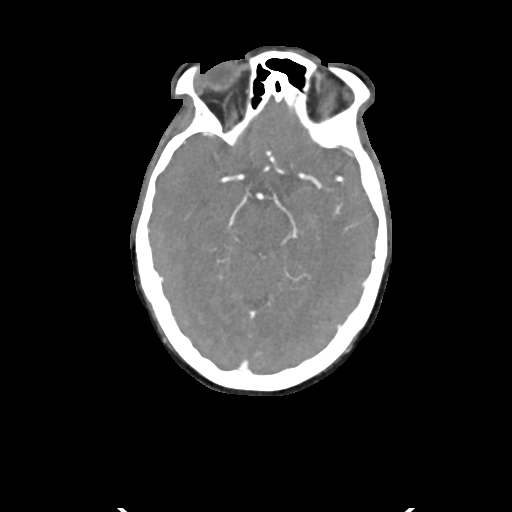

[Series 7: coronal thin · coronal · 0.42mm/px · 3 of 257 slices shown]
[im 74/257  soft-tissue]
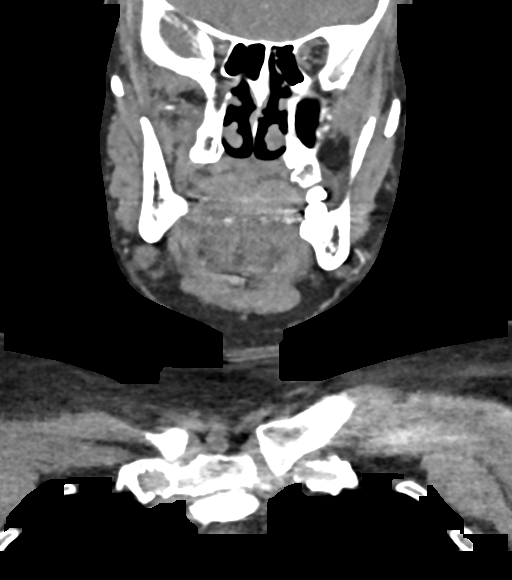
[im 110/257  soft-tissue]
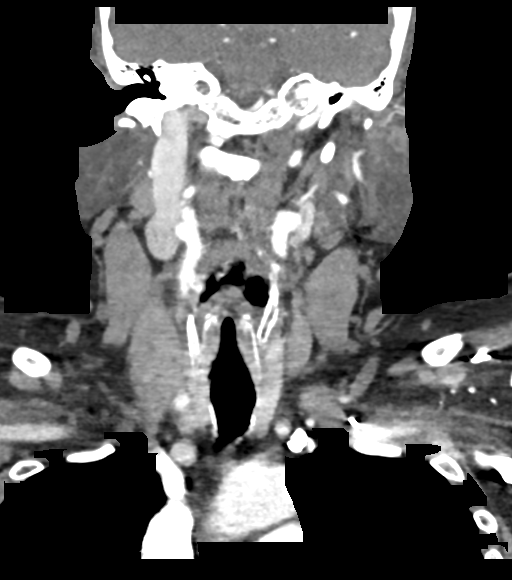
[im 147/257  soft-tissue]
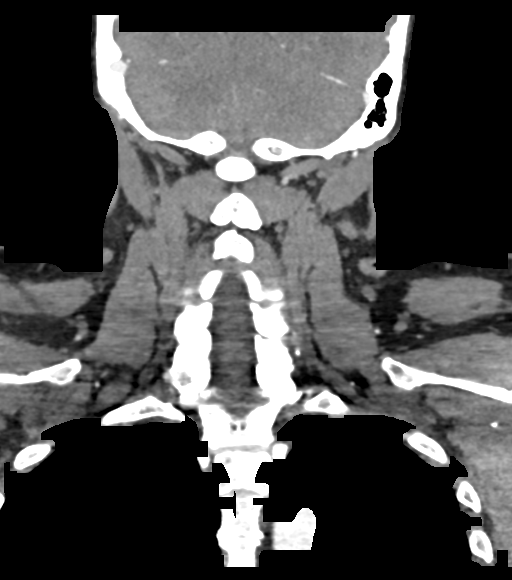

[16 of 46 positions shown; findings below may reference images not displayed]

RADIATION DOSE REDUCTION: This exam was performed according to the
departmental dose-optimization program which includes automated
exposure control, adjustment of the mA and/or kV according to
patient size and/or use of iterative reconstruction technique.

CONTRAST:  75mL OMNIPAQUE IOHEXOL 350 MG/ML SOLN
FINDINGS: Skeleton: No acute osseous abnormality identified.

Upper chest: Negative. Visible main pulmonary artery appears patent.

Other neck: Motion artifact at the glottis and oropharynx. No acute
neck soft tissue finding identified.

Visible brain parenchyma and orbits appear negative.

Aortic arch: 4 vessel arch configuration with the left vertebral
artery arising directly from the arch distal near the left
subclavian artery origin. No arch atherosclerosis.

Right carotid system: Mild motion artifact at the distal right CCA,
but no evidence of plaque or stenosis.

Left carotid system: Mild tortuosity. Mild motion artifact at the
level of the thyroid. No evidence of plaque or stenosis.

Vertebral arteries:
Proximal right subclavian artery and cervical right vertebral artery
appear normal aside from mild tortuosity.

Left vertebral artery arises directly from the arch and is patent to
the skull base with tortuosity but no evidence of plaque or
stenosis.

CTA HEAD

Only 2 mm imaging provided with this CTA Neck.

Posterior circulation: Fairly codominant vertebral V4 segments,
vertebrobasilar junction and visible basilar artery appear patent
and negative. Fetal type left PCA origin. Visible PCA branches are
within normal limits.

Anterior circulation: Both ICA siphons and carotid termini are
patent. Grossly normal visible MCAs and ACAs. Both ophthalmic artery
origins and the left posterior communicating artery origin appear
normal.

Anatomic variants: Left vertebral artery arises directly from the
arch. Fetal type left PCA origin.

Review of the MIP images confirms the above findings
IMPRESSION: Negative CTA Neck aside from arterial tortuosity.

## 2022-01-07 MED ORDER — IOHEXOL 350 MG/ML SOLN
75.0000 mL | Freq: Once | INTRAVENOUS | Status: AC | PRN
  Administered 2022-01-07: 75 mL via INTRAVENOUS

## 2022-01-07 MED ORDER — ROSUVASTATIN CALCIUM 20 MG PO TABS: 20.0000 mg | ORAL_TABLET | Freq: Every day | ORAL | 2 refills | Status: AC

## 2022-01-07 MED ORDER — ASPIRIN 81 MG PO TBEC: 81.0000 mg | DELAYED_RELEASE_TABLET | Freq: Every day | ORAL | 2 refills | Status: AC

## 2022-01-07 MED ORDER — ASPIRIN EC 81 MG PO TBEC
81.0000 mg | DELAYED_RELEASE_TABLET | Freq: Every day | ORAL | Status: DC
  Administered 2022-01-07: 81 mg via ORAL
  Filled 2022-01-07: qty 1

## 2022-01-07 MED ORDER — CLOPIDOGREL BISULFATE 75 MG PO TABS: 75.0000 mg | ORAL_TABLET | Freq: Every day | ORAL | 0 refills | Status: AC

## 2022-01-07 MED ORDER — CLOPIDOGREL BISULFATE 75 MG PO TABS
75.0000 mg | ORAL_TABLET | Freq: Every day | ORAL | Status: DC
  Administered 2022-01-07: 75 mg via ORAL
  Filled 2022-01-07: qty 1

## 2022-01-07 MED ORDER — ROSUVASTATIN CALCIUM 20 MG PO TABS
20.0000 mg | ORAL_TABLET | Freq: Every day | ORAL | Status: DC
  Administered 2022-01-07: 20 mg via ORAL
  Filled 2022-01-07: qty 1

## 2022-01-07 NOTE — Discharge Instructions (Signed)
Martha Yang [employer], ? ?You were in the hospital with some vision loss and right sided weakness that improved. We think this was a TIA which has now resolved. Please take your new medications (Aspirin, Plavix and Crestor) and follow-up with the neurologist. ?

## 2022-01-07 NOTE — Progress Notes (Addendum)
STROKE TEAM PROGRESS NOTE   INTERVAL HISTORY Patient is seen in her room with multiple family members at the bedside.  Yesterday morning, she reports that she awoke early in the morning and was not feeling well.  She had a slight headache and was short of breath.  She noticed that she was unable to see out of her right eye and that her eye was reddened and swollen.  Her symptoms resolved spontaneously, and her MRI shows no evidence of stroke.  Vitals:   01/06/22 2005 01/06/22 2351 01/07/22 0342 01/07/22 1100  BP: 117/72 120/77 125/88   Pulse: 85 77 65   Resp: 18 18 20    Temp: 98.4 F (36.9 C) 98.3 F (36.8 C) 98.5 F (36.9 C)   TempSrc: Oral Oral Oral   SpO2: 95% 96% 99%   Weight:      Height:    5' 2.99" (1.6 m)   CBC:  Recent Labs  Lab 01/06/22 0133 01/07/22 0133  WBC 8.3 7.7  NEUTROABS 4.7  --   HGB 11.1* 10.6*  HCT 35.1* 33.0*  MCV 72.1* 72.5*  PLT 333 307   Basic Metabolic Panel:  Recent Labs  Lab 01/06/22 0144 01/07/22 0133  NA 135 135  K 3.8 3.7  CL 102 107  CO2 25 23  GLUCOSE 113* 130*  BUN 13 9  CREATININE 0.58 0.66  CALCIUM 9.2 8.9   Lipid Panel:  Recent Labs  Lab 01/06/22 1114  CHOL 202*  TRIG 80  HDL 53  CHOLHDL 3.8  VLDL 16  LDLCALC 657*   HgbA1c:  Recent Labs  Lab 01/06/22 1114  HGBA1C 6.2*   Urine Drug Screen:  Recent Labs  Lab 01/06/22 0321  LABOPIA NEGATIVE*  COCAINSCRNUR NEGATIVE*  LABBENZ NEGATIVE*  AMPHETMU NEGATIVE*  THCU NEGATIVE*  LABBARB NEGATIVE*    Alcohol Level  Recent Labs  Lab 01/06/22 0144  ETH <10    IMAGING past 24 hours CT ANGIO NECK W OR WO CONTRAST  Result Date: 01/07/2022 CLINICAL DATA:  46 year old female with neurologic deficit. Blurred vision. Unrevealing MRI, MRA, MRV. EXAM: CT ANGIOGRAPHY NECK TECHNIQUE: Multidetector CT imaging of the neck was performed using the standard protocol during bolus administration of intravenous contrast. Multiplanar CT image reconstructions and MIPs were obtained  to evaluate the vascular anatomy. Carotid stenosis measurements (when applicable) are obtained utilizing NASCET criteria, using the distal internal carotid diameter as the denominator. RADIATION DOSE REDUCTION: This exam was performed according to the departmental dose-optimization program which includes automated exposure control, adjustment of the mA and/or kV according to patient size and/or use of iterative reconstruction technique. CONTRAST:  75mL OMNIPAQUE IOHEXOL 350 MG/ML SOLN COMPARISON:  MRA head and neck yesterday. FINDINGS: Skeleton: No acute osseous abnormality identified. Upper chest: Negative. Visible main pulmonary artery appears patent. Other neck: Motion artifact at the glottis and oropharynx. No acute neck soft tissue finding identified. Visible brain parenchyma and orbits appear negative. Aortic arch: 4 vessel arch configuration with the left vertebral artery arising directly from the arch distal near the left subclavian artery origin. No arch atherosclerosis. Right carotid system: Mild motion artifact at the distal right CCA, but no evidence of plaque or stenosis. Left carotid system: Mild tortuosity. Mild motion artifact at the level of the thyroid. No evidence of plaque or stenosis. Vertebral arteries: Proximal right subclavian artery and cervical right vertebral artery appear normal aside from mild tortuosity. Left vertebral artery arises directly from the arch and is patent to the skull base with  tortuosity but no evidence of plaque or stenosis. CTA HEAD Only 2 mm imaging provided with this CTA Neck. Posterior circulation: Fairly codominant vertebral V4 segments, vertebrobasilar junction and visible basilar artery appear patent and negative. Fetal type left PCA origin. Visible PCA branches are within normal limits. Anterior circulation: Both ICA siphons and carotid termini are patent. Grossly normal visible MCAs and ACAs. Both ophthalmic artery origins and the left posterior communicating  artery origin appear normal. Anatomic variants: Left vertebral artery arises directly from the arch. Fetal type left PCA origin. Review of the MIP images confirms the above findings IMPRESSION: Negative CTA Neck aside from arterial tortuosity. Electronically Signed   By: Odessa Fleming M.D.   On: 01/07/2022 10:11   MR ANGIO HEAD WO CONTRAST  Result Date: 01/06/2022 CLINICAL DATA:  Neuro deficit, acute, stroke suspected; dural venous sinus thrombus suspected EXAM: MRI HEAD WITHOUT CONTRAST MRA HEAD WITHOUT CONTRAST MRA NECK WITHOUT AND WITH CONTRAST MRV HEAD WITHOUT AND WITH CONTRAST TECHNIQUE: Multiplanar, multi-echo pulse sequences of the brain and surrounding structures were acquired without intravenous contrast. Angiographic images of the Circle of Willis were acquired using MRA technique without intravenous contrast. Angiographic images of the neck were acquired using MRA technique without and with intravenous contrast. Carotid stenosis measurements (when applicable) are obtained utilizing NASCET criteria, using the distal internal carotid diameter as the denominator. Angiographic images of the head were obtained using MRV technique without and with intravenous contrast. CONTRAST:  10mL GADAVIST GADOBUTROL 1 MMOL/ML IV SOLN COMPARISON:  None FINDINGS: MRI HEAD Brain: There is no acute infarction or intracranial hemorrhage. There is no intracranial mass, mass effect, or edema. There is no hydrocephalus or extra-axial fluid collection. Ventricles and sulci are normal in size and configuration. A punctate focus of T2 hyperintensity in the left frontal white matter likely reflects nonspecific gliosis/demyelination of doubtful significance. Vascular: Major vessel flow voids at the skull base are preserved. Skull and upper cervical spine: Normal marrow signal is preserved. Sinuses/Orbits: Paranasal sinuses are aerated. Orbits are unremarkable. Other: Sella is unremarkable.  Mastoid air cells are clear. MRA HEAD  Intracranial internal carotid arteries are patent. Middle and anterior cerebral arteries are patent. Intracranial vertebral arteries, basilar artery, posterior cerebral arteries are patent. Left posterior communicating artery is present. There is no significant stenosis or aneurysm. MRA NECK Common, internal, and external carotid arteries are patent. Codominant vertebral arteries are patent. No hemodynamically significant stenosis or evidence of dissection. MRV HEAD Superior sagittal sinus, straight sinus, vein of Galen, and both internal cerebral veins are patent. Transverse and sigmoid sinuses are patent. No evidence of dural sinus thrombosis. IMPRESSION: No acute or significant intracranial abnormality. Unremarkable vascular imaging. Electronically Signed   By: Guadlupe Spanish M.D.   On: 01/06/2022 18:00   MR Angiogram Neck W or Wo Contrast  Result Date: 01/06/2022 CLINICAL DATA:  Neuro deficit, acute, stroke suspected; dural venous sinus thrombus suspected EXAM: MRI HEAD WITHOUT CONTRAST MRA HEAD WITHOUT CONTRAST MRA NECK WITHOUT AND WITH CONTRAST MRV HEAD WITHOUT AND WITH CONTRAST TECHNIQUE: Multiplanar, multi-echo pulse sequences of the brain and surrounding structures were acquired without intravenous contrast. Angiographic images of the Circle of Willis were acquired using MRA technique without intravenous contrast. Angiographic images of the neck were acquired using MRA technique without and with intravenous contrast. Carotid stenosis measurements (when applicable) are obtained utilizing NASCET criteria, using the distal internal carotid diameter as the denominator. Angiographic images of the head were obtained using MRV technique without and with intravenous contrast. CONTRAST:  10mL GADAVIST GADOBUTROL 1 MMOL/ML IV SOLN COMPARISON:  None FINDINGS: MRI HEAD Brain: There is no acute infarction or intracranial hemorrhage. There is no intracranial mass, mass effect, or edema. There is no hydrocephalus or  extra-axial fluid collection. Ventricles and sulci are normal in size and configuration. A punctate focus of T2 hyperintensity in the left frontal white matter likely reflects nonspecific gliosis/demyelination of doubtful significance. Vascular: Major vessel flow voids at the skull base are preserved. Skull and upper cervical spine: Normal marrow signal is preserved. Sinuses/Orbits: Paranasal sinuses are aerated. Orbits are unremarkable. Other: Sella is unremarkable.  Mastoid air cells are clear. MRA HEAD Intracranial internal carotid arteries are patent. Middle and anterior cerebral arteries are patent. Intracranial vertebral arteries, basilar artery, posterior cerebral arteries are patent. Left posterior communicating artery is present. There is no significant stenosis or aneurysm. MRA NECK Common, internal, and external carotid arteries are patent. Codominant vertebral arteries are patent. No hemodynamically significant stenosis or evidence of dissection. MRV HEAD Superior sagittal sinus, straight sinus, vein of Galen, and both internal cerebral veins are patent. Transverse and sigmoid sinuses are patent. No evidence of dural sinus thrombosis. IMPRESSION: No acute or significant intracranial abnormality. Unremarkable vascular imaging. Electronically Signed   By: Guadlupe Spanish M.D.   On: 01/06/2022 18:00   MR BRAIN WO CONTRAST  Result Date: 01/06/2022 CLINICAL DATA:  Neuro deficit, acute, stroke suspected; dural venous sinus thrombus suspected EXAM: MRI HEAD WITHOUT CONTRAST MRA HEAD WITHOUT CONTRAST MRA NECK WITHOUT AND WITH CONTRAST MRV HEAD WITHOUT AND WITH CONTRAST TECHNIQUE: Multiplanar, multi-echo pulse sequences of the brain and surrounding structures were acquired without intravenous contrast. Angiographic images of the Circle of Willis were acquired using MRA technique without intravenous contrast. Angiographic images of the neck were acquired using MRA technique without and with intravenous  contrast. Carotid stenosis measurements (when applicable) are obtained utilizing NASCET criteria, using the distal internal carotid diameter as the denominator. Angiographic images of the head were obtained using MRV technique without and with intravenous contrast. CONTRAST:  10mL GADAVIST GADOBUTROL 1 MMOL/ML IV SOLN COMPARISON:  None FINDINGS: MRI HEAD Brain: There is no acute infarction or intracranial hemorrhage. There is no intracranial mass, mass effect, or edema. There is no hydrocephalus or extra-axial fluid collection. Ventricles and sulci are normal in size and configuration. A punctate focus of T2 hyperintensity in the left frontal white matter likely reflects nonspecific gliosis/demyelination of doubtful significance. Vascular: Major vessel flow voids at the skull base are preserved. Skull and upper cervical spine: Normal marrow signal is preserved. Sinuses/Orbits: Paranasal sinuses are aerated. Orbits are unremarkable. Other: Sella is unremarkable.  Mastoid air cells are clear. MRA HEAD Intracranial internal carotid arteries are patent. Middle and anterior cerebral arteries are patent. Intracranial vertebral arteries, basilar artery, posterior cerebral arteries are patent. Left posterior communicating artery is present. There is no significant stenosis or aneurysm. MRA NECK Common, internal, and external carotid arteries are patent. Codominant vertebral arteries are patent. No hemodynamically significant stenosis or evidence of dissection. MRV HEAD Superior sagittal sinus, straight sinus, vein of Galen, and both internal cerebral veins are patent. Transverse and sigmoid sinuses are patent. No evidence of dural sinus thrombosis. IMPRESSION: No acute or significant intracranial abnormality. Unremarkable vascular imaging. Electronically Signed   By: Guadlupe Spanish M.D.   On: 01/06/2022 18:00   MR MRV HEAD W WO CONTRAST  Result Date: 01/06/2022 CLINICAL DATA:  Neuro deficit, acute, stroke suspected;  dural venous sinus thrombus suspected EXAM: MRI HEAD WITHOUT CONTRAST MRA  HEAD WITHOUT CONTRAST MRA NECK WITHOUT AND WITH CONTRAST MRV HEAD WITHOUT AND WITH CONTRAST TECHNIQUE: Multiplanar, multi-echo pulse sequences of the brain and surrounding structures were acquired without intravenous contrast. Angiographic images of the Circle of Willis were acquired using MRA technique without intravenous contrast. Angiographic images of the neck were acquired using MRA technique without and with intravenous contrast. Carotid stenosis measurements (when applicable) are obtained utilizing NASCET criteria, using the distal internal carotid diameter as the denominator. Angiographic images of the head were obtained using MRV technique without and with intravenous contrast. CONTRAST:  10mL GADAVIST GADOBUTROL 1 MMOL/ML IV SOLN COMPARISON:  None FINDINGS: MRI HEAD Brain: There is no acute infarction or intracranial hemorrhage. There is no intracranial mass, mass effect, or edema. There is no hydrocephalus or extra-axial fluid collection. Ventricles and sulci are normal in size and configuration. A punctate focus of T2 hyperintensity in the left frontal white matter likely reflects nonspecific gliosis/demyelination of doubtful significance. Vascular: Major vessel flow voids at the skull base are preserved. Skull and upper cervical spine: Normal marrow signal is preserved. Sinuses/Orbits: Paranasal sinuses are aerated. Orbits are unremarkable. Other: Sella is unremarkable.  Mastoid air cells are clear. MRA HEAD Intracranial internal carotid arteries are patent. Middle and anterior cerebral arteries are patent. Intracranial vertebral arteries, basilar artery, posterior cerebral arteries are patent. Left posterior communicating artery is present. There is no significant stenosis or aneurysm. MRA NECK Common, internal, and external carotid arteries are patent. Codominant vertebral arteries are patent. No hemodynamically significant  stenosis or evidence of dissection. MRV HEAD Superior sagittal sinus, straight sinus, vein of Galen, and both internal cerebral veins are patent. Transverse and sigmoid sinuses are patent. No evidence of dural sinus thrombosis. IMPRESSION: No acute or significant intracranial abnormality. Unremarkable vascular imaging. Electronically Signed   By: Guadlupe Spanish M.D.   On: 01/06/2022 18:00    PHYSICAL EXAM General: Alert, well-developed, well-nourished patient in no acute distress Respiratory:  Regular, unlabored respirations on room air  NEURO:  Mental Status: AA&Ox3  Speech/Language: speech is without dysarthria or aphasia.  Fluency, and comprehension intact.  Cranial Nerves:  II: PERRL. Visual fields full.  III, IV, VI: EOMI. Eyelids elevate symmetrically.  V: Sensation is intact to light touch and symmetrical to face.  VII: Smile is symmetrical.  VIII: hearing intact to voice. IX, X: Phonation is normal.  ZO:[id number] shrug 5/5. XII: tongue is midline without fasciculations. Motor: 5/5 strength to all muscle groups tested.  Tone: is normal and bulk is normal Sensation- Intact to light touch bilaterally.  Coordination: FTN intact bilaterally.No drift.  Gait- deferred   ASSESSMENT/PLAN Ms. Martha Yang is a 46 y.o. female with history of DM2 and GERD presenting with sudden onset inability to see out of her right eye and that her eye with redness and swelling.  Her symptoms resolved spontaneously, and her MRI shows no evidence of stroke.  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  Woke up in the morning with right eye redness, right eye loss of vision, right facial funny sensation.  No headache, no eye pain.  Symptom lasted about 30 to 40 minutes and resolved. CT head No acute abnormality.  CTA neck negative for acute abnormality MRI  negative for acute abnormality MRA head and neck negative for stenosis or LVO MRV  negative 2D Echo EF 65 to 70% LDL 133 HgbA1c 6.2 VTE prophylaxis - lovenox No antithrombotic prior to admission, now on aspirin 81  mg daily and clopidogrel 75 mg daily for 3 weeks and then aspirin alone. 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, lasted half a day Again both blindness episode at age of 65, short lasting, no headache Again right eye blindness episode at age of 42, lasting briefly, after watching, right eye vision resolved Intermittent headache associated with menstrual periods, lasting one day  Current episode of right eye vision loss lasted 30 to 40 minutes, associate with and right eye and facial funny sensation, right eye redness at the time of onset and mild headache the night before  Hyperlipidemia Home meds:  none LDL 133, goal < 70 Add rosuvastatin 20 mg daily  Continue statin at discharge  Diabetes type II Controlled Home meds:  metformin 500 mg daily HgbA1c 6.2, goal < 7.0 CBGs SSI Close outpatient PCP follow-up  Other Stroke Risk Factors Obesity, Body mass index is 42.85 kg/m., BMI >/= 30 associated with increased stroke risk, recommend weight loss, diet and exercise as appropriate   Other Active Problems none  Hospital day # 0  Cortney E Ernestina Columbia , MSN, AGACNP-BC [facility] See Amion for schedule and pager information 01/07/2022 12:13 PM  ATTENDING NOTE: I reviewed above note and agree with the assessment and plan. Pt was seen and examined.   46 year old female with history of diabetes admitted for episode of right eye vision loss.  The night before the episode patient had a mild headache.  On waking up in the morning, patient has 30 to 40 minutes of right eye vision loss, associate with right facial funny feeling, right  eye redness but no eye pain or headache.  Per report, patient had blindness episode at age 77, 40 and 15, short lasting.  Intermittent headache with menstrual periods.  Work-up including CT negative, CTA neck no carotid disease, MRI brain no acute infarct.  MRA head and neck and MRV all negative.  ESR 25, CRP 0.6, LDL 133, A1c 6.2.  EF 65 to 70%.  Creatinine 0.66, UDS negative. On exam, Spanish speaking, however neurologically intact, visual acuity intact, visual fields full.    Etiology of her patient transient right eye vision loss concerning for ocular migraine versus right amaurosis fugax.  She needs outpatient ophthalmology follow-up, recommend aspirin 81 and Plavix 75 DAPT for 3 weeks and then aspirin alone.  Added Crestor for hyperlipidemia.  For detailed assessment and plan, please refer to above as I have made changes wherever appropriate.   Neurology will sign off. Please call with questions. Pt will follow up with stroke clinic NP at [facility] in about 4 weeks. Thanks for the consult.   Marvel Plan, MD PhD Stroke Neurology 01/07/2022 4:25 PM    To contact Stroke Continuity provider, please refer to [email or web address]. After hours, contact General Neurology

## 2022-01-07 NOTE — Progress Notes (Signed)
PT Cancellation Note ? ?Patient Details ?Name: Martha Yang ?MRN: [id number] ?DOB: Jul 24, 1976 ? ? ?Cancelled Treatment:    Reason Eval/Treat Not Completed: Patient at procedure or test/unavailable ? ?Patient working with OT. Noted NIHSS=0. Will follow-up with PT evaluation if indicated after OT evaluates patient.  ? ? ?Jerolyn Center, PT ?Acute Rehabilitation Services  ?Pager [phone] ?Office [phone] ? ?Martha Yang ?01/07/2022, 10:17 AM ?

## 2022-01-07 NOTE — Discharge Summary (Signed)
?Physician Discharge Summary ?  ?Patient: Martha Yang MRN: [id number] DOB: [birth year]  ?Admit date:     01/06/2022  ?Discharge date: 01/07/22  ?Discharge Physician: Cordelia Poche, MD  ? ?PCP: Pcp, No  ? ?Recommendations at discharge:  ? ?Outpatient neurology follow-up in 4 weeks ?Outpatient hospital follow-up with PCP ?Follow-up ferritin ? ?Discharge Diagnoses: ?Principal Problem: ?  TIA (transient ischemic attack) ?Active Problems: ?  Microcytic hypochromic anemia ?  Controlled type 2 diabetes mellitus without complication, without long-term current use of insulin ([place]) ? ?Resolved Problems: ?  * No resolved hospital problems. * ? ?Admission HPI: ?Written by Fuller Plan, MD ? ?HPI: Martha Yang is a 46 y.o. female with medical history significant of diabetes mellitus and acid reflux who presents with presents with complaints of right eye vision loss.  History is obtained with use of Spanish interpreter.  Patient reports that she had a mild headache and went to bed around 11:50 PM last night.  She was awakened out of her sleep in the middle of the night feeling short of breath.  When she opened her eyes she realized she was unable to see out of the right eye.  At that time she started panicking.  She states that her right eye was all red.  She still has pain that feels heavy.  Patient works at [employer] on [address] and wonders if that has something to do with that.  Other associated symptoms included complaints of intermittent palpitations, numbness right side of her face, headache, and reports of weakness on the right arm. ? ? ?Assessment and Plan: ? ?TIA ?MRI confirms no stroke. MRA/CTA head and neck without large vessel occlusion. LDL of 133. Hemoglobin A1C of 6.2%. Transthoracic Echocardiogram significant for no atrial thrombus or atrial level shunt. CRP wnl. ESR slightly elevated at 25. RPR non-reactive. Neurology recommendations for Aspirin indefinitely and Plavix for a total of 21 days.  PT/OT recommendations for no follow-up. Patient discharged on Aspirin, Plavix and Crestor. Recommendation for Neurology follow-up in 4 weeks. ? ?Microcytic anemia ?Stable. Menstruating female. Iron panel significant for normal iron with high TIBC and low saturations. No ferritin available. Ferritin ordered and is pending on discharge. ? ?Diabetes mellitus, type 2 ?Patient is on metformin as an outpatient. Hemoglobin A1C of 6.2%. Well controlled.  ? ?Morbid obesity ?Body mass index is 42.85 kg/m?Marland Kitchen Recommend dietitian referral. ? ? ?  ? ? ?Consultants: Neurology ?Procedures performed: Transthoracic Echocardiogram ?Disposition: Home ?Diet recommendation:  ?Cardiac and Carb modified diet ? ?DISCHARGE MEDICATION: ?Allergies as of 01/07/2022   ?No Known Allergies ?  ? ?  ?Medication List  ?  ? ?STOP taking these medications   ? ?ibuprofen 200 MG tablet ?Commonly known as: ADVIL ?  ?ibuprofen 800 MG tablet ?Commonly known as: ADVIL ?  ? ?  ? ?TAKE these medications   ? ?albuterol 108 (90 Base) MCG/ACT inhaler ?Commonly known as: VENTOLIN HFA ?Inhale 1-2 puffs into the lungs every 6 (six) hours as needed for wheezing or shortness of breath. ?  ?aspirin 81 MG EC tablet ?Take 1 tablet (81 mg total) by mouth daily. Swallow whole. ?Start taking on: Jan 08, 2022 ?  ?clopidogrel 75 MG tablet ?Commonly known as: PLAVIX ?Take 1 tablet (75 mg total) by mouth daily for 20 days. ?Start taking on: Jan 08, 2022 ?  ?clotrimazole-betamethasone cream ?Commonly known as: LOTRISONE ?Apply 1 application. topically 2 (two) times daily as needed for rash. ?  ?Magnesium 500 MG Tabs ?Take 500  mg by mouth daily. ?  ?metFORMIN 500 MG 24 hr tablet ?Commonly known as: GLUCOPHAGE-XR ?Take 500 mg by mouth daily with breakfast. ?  ?montelukast 10 MG tablet ?Commonly known as: SINGULAIR ?Take 10 mg by mouth at bedtime as needed (allergy symptoms). ?  ?nystatin powder ?Commonly known as: MYCOSTATIN/NYSTOP ?Apply 1 application. topically 2 (two) times daily  as needed (rash). ?  ?pantoprazole 40 MG tablet ?Commonly known as: PROTONIX ?Take 40 mg by mouth daily. ?  ?rosuvastatin 20 MG tablet ?Commonly known as: CRESTOR ?Take 1 tablet (20 mg total) by mouth daily. ?Start taking on: Jan 08, 2022 ?  ? ?  ? ? Follow-up Information   ? ? [facility]. Schedule an appointment as soon as possible for a visit in 4 week(s).   ?Specialty: Neurology ?Why: For hospital follow-up ?Contact information: ?[place] ?[phone] ? ?  ?  ? ?  ?  ? ?  ? ?Discharge Exam: ?BP 135/88 (BP Location: Left Arm)   Pulse 77   Temp 98 ?F (36.7 ?C) (Oral)   Resp 20   Ht 5' 2.99" (1.6 m)   Wt 109.7 kg   LMP 12/19/2021   SpO2 99%   BMI 42.85 kg/m?  ? ?General exam: Appears calm and comfortable ?Respiratory system: Clear to auscultation. Respiratory effort normal. ?Cardiovascular system: S1 & S2 heard, RRR. ?Gastrointestinal system: Abdomen is nondistended, soft and nontender. Normal bowel sounds heard. ?Central nervous system: Alert and oriented. No focal neurological deficits. ?Musculoskeletal: No edema. No calf tenderness ?Skin: No cyanosis. No rashes ?Psychiatry: Judgement and insight appear normal. Mood & affect appropriate.  ? ?Condition at discharge: stable ? ?The results of significant diagnostics from this hospitalization (including imaging, microbiology, ancillary and laboratory) are listed below for reference.  ? ?Imaging Studies: ?CT HEAD WO CONTRAST ? ?Result Date: 01/06/2022 ?CLINICAL DATA:  Blurred vision. EXAM: CT HEAD WITHOUT CONTRAST TECHNIQUE: Contiguous axial images were obtained from the base of the skull through the vertex without intravenous contrast. RADIATION DOSE REDUCTION: This exam was performed according to the departmental dose-optimization program which includes automated exposure control, adjustment of the mA and/or kV according to patient size and/or use of iterative reconstruction technique.  COMPARISON:  None. FINDINGS: Brain: No evidence of acute infarction, hemorrhage, hydrocephalus, extra-axial collection or mass lesion/mass effect. Vascular: No hyperdense vessel or unexpected calcification. Skull: Normal. Negative for fracture or focal lesion. Sinuses/Orbits: No acute finding. Other: None. IMPRESSION: No acute intracranial pathology. Electronically Signed   By: Virgina Norfolk M.D.   On: 01/06/2022 01:59  ? ?CT ANGIO NECK W OR WO CONTRAST ? ?Result Date: 01/07/2022 ?CLINICAL DATA:  46 year old female with neurologic deficit. Blurred vision. Unrevealing MRI, MRA, MRV. EXAM: CT ANGIOGRAPHY NECK TECHNIQUE: Multidetector CT imaging of the neck was performed using the standard protocol during bolus administration of intravenous contrast. Multiplanar CT image reconstructions and MIPs were obtained to evaluate the vascular anatomy. Carotid stenosis measurements (when applicable) are obtained utilizing NASCET criteria, using the distal internal carotid diameter as the denominator. RADIATION DOSE REDUCTION: This exam was performed according to the departmental dose-optimization program which includes automated exposure control, adjustment of the mA and/or kV according to patient size and/or use of iterative reconstruction technique. CONTRAST:  38m OMNIPAQUE IOHEXOL 350 MG/ML SOLN COMPARISON:  MRA head and neck yesterday. FINDINGS: Skeleton: No acute osseous abnormality identified. Upper chest: Negative. Visible main pulmonary artery appears patent. Other neck: Motion artifact at the glottis and oropharynx. No  acute neck soft tissue finding identified. Visible brain parenchyma and orbits appear negative. Aortic arch: 4 vessel arch configuration with the left vertebral artery arising directly from the arch distal near the left subclavian artery origin. No arch atherosclerosis. Right carotid system: Mild motion artifact at the distal right CCA, but no evidence of plaque or stenosis. Left carotid system: Mild  tortuosity. Mild motion artifact at the level of the thyroid. No evidence of plaque or stenosis. Vertebral arteries: Proximal right subclavian artery and cervical right vertebral artery appear normal aside from mil

## 2022-01-07 NOTE — Progress Notes (Signed)
PT Cancellation Note ? ?Patient Details ?Name: Martha Yang ?MRN: [id number] ?DOB: 07/31/1976 ? ? ?Cancelled Treatment:    Reason Eval/Treat Not Completed: PT screened, no needs identified, will sign off ? ?OT has evaluated and pt is back to baseline. PT evaluation not warranted and will sign off ? ? ?Jerolyn Center, PT ?Acute Rehabilitation Services  ?Pager [phone] ?Office [phone] ? ? ?Scherrie November Azure Barrales ?01/07/2022, 10:37 AM ?

## 2022-01-07 NOTE — Progress Notes (Signed)
*  PRELIMINARY RESULTS* ?Echocardiogram ?2D Echocardiogram has been performed. ? ?Martha Yang ?01/07/2022, 3:08 PM ?

## 2022-01-07 NOTE — Evaluation (Signed)
Occupational Therapy Evaluation ?Patient Details ?Name: Martha Yang ?MRN: [id number] ?DOB: 1976/02/23 ?Today's Date: 01/07/2022 ? ? ?History of Present Illness 46 y.o. F admitted on 01/06/22 due to SoB, R eye vision disturbances, and R sided weakness. MRI negative. PMH significant for DM2 and acid reflux.  ? ?Clinical Impression ?  ?Pt admitted for concerns listed above. PTA pt reported that she was independent with all ADL's and IADL's, including working 2 jobs. At this time, pt presents back at her baseline. She is able to complete BADL's and functional mobility with no assist, safely. She has no further OT needs at this time and acute OT will sign off.   ?   ? ?Recommendations for follow up therapy are one component of a multi-disciplinary discharge planning process, led by the attending physician.  Recommendations may be updated based on patient status, additional functional criteria and insurance authorization.  ? ?Follow Up Recommendations ? No OT follow up  ?  ?Assistance Recommended at Discharge None  ?Patient can return home with the following   ? ?  ?Functional Status Assessment ? Patient has had a recent decline in their functional status and demonstrates the ability to make significant improvements in function in a reasonable and predictable amount of time.  ?Equipment Recommendations ? None recommended by OT  ?  ?Recommendations for Other Services   ? ? ?  ?Precautions / Restrictions Precautions ?Precautions: None ?Restrictions ?Weight Bearing Restrictions: No  ? ?  ? ?Mobility Bed Mobility ?Overal bed mobility: Independent ?  ?  ?  ?  ?  ?  ?  ?  ? ?Transfers ?Overall transfer level: Independent ?  ?  ?  ?  ?  ?  ?  ?  ?  ?  ? ?  ?Balance Overall balance assessment: Independent ?  ?  ?  ?  ?  ?  ?  ?  ?  ?  ?  ?  ?  ?  ?  ?  ?  ?  ?   ? ?ADL either performed or assessed with clinical judgement  ? ?ADL Overall ADL's : At baseline;Independent ?  ?  ?  ?  ?  ?  ?  ?  ?  ?  ?  ?  ?  ?  ?  ?  ?  ?  ?   ?General ADL Comments: No concerns. Pt appears back at her baseline.  ? ? ? ?Vision Baseline Vision/History: 0 No visual deficits ?Ability to See in Adequate Light: 0 Adequate ?Patient Visual Report: No change from baseline ?Vision Assessment?: No apparent visual deficits ?Additional Comments: Pt reported yesterday having no vision in her R eye, she states that today it seems back to her baseline. Tracking, peripherals, acuity, saccades, and convergence WFL.  ?   ?Perception   ?  ?Praxis   ?  ? ?Pertinent Vitals/Pain Pain Assessment ?Pain Assessment: No/denies pain  ? ? ? ?Hand Dominance Right ?  ?Extremity/Trunk Assessment Upper Extremity Assessment ?Upper Extremity Assessment: Overall WFL for tasks assessed ?  ?Lower Extremity Assessment ?Lower Extremity Assessment: Overall WFL for tasks assessed ?  ?Cervical / Trunk Assessment ?Cervical / Trunk Assessment: Normal ?  ?Communication Communication ?Communication: No difficulties ?  ?Cognition Arousal/Alertness: Awake/alert ?Behavior During Therapy: [facility] for tasks assessed/performed ?Overall Cognitive Status: Within Functional Limits for tasks assessed ?  ?  ?  ?  ?  ?  ?  ?  ?  ?  ?  ?  ?  ?  ?  ?  ?  ?  ?  ?  General Comments  VSS on RA ? ?  ?Exercises   ?  ?Shoulder Instructions    ? ? ?Home Living Family/patient expects to be discharged to:: Private residence ?Living Arrangements: Children ?Available Help at Discharge: Family ?Type of Home: House ?Home Access: Level entry ?  ?  ?Home Layout: Two level;Bed/bath upstairs ?Alternate Level Stairs-Number of Steps: full flight ?  ?Bathroom Shower/Tub: Tub/shower unit ?  ?Bathroom Toilet: Standard ?  ?  ?Home Equipment: None ?  ?Additional Comments: Pt works 2 jobs ?  ? ?  ?Prior Functioning/Environment Prior Level of Function : Independent/Modified Independent;Working/employed;Driving ?  ?  ?  ?  ?  ?  ?  ?  ?  ? ?  ?  ?OT Problem List: Decreased strength ?  ?   ?OT Treatment/Interventions:    ?  ?OT Goals(Current goals  can be found in the care plan section) Acute Rehab OT Goals ?Patient Stated Goal: To go home ?OT Goal Formulation: With patient ?Time For Goal Achievement: 01/07/22 ?Potential to Achieve Goals: Good  ?OT Frequency:   ?  ? ?Co-evaluation   ?  ?  ?  ?  ? ?  ?AM-PAC OT "6 Clicks" Daily Activity     ?Outcome Measure Help from another person eating meals?: None ?Help from another person taking care of personal grooming?: None ?Help from another person toileting, which includes using toliet, bedpan, or urinal?: None ?Help from another person bathing (including washing, rinsing, drying)?: None ?Help from another person to put on and taking off regular upper body clothing?: None ?Help from another person to put on and taking off regular lower body clothing?: None ?6 Click Score: 24 ?  ?End of Session Nurse Communication: Mobility status ? ?Activity Tolerance: Patient tolerated treatment well ?Patient left: in bed;with call bell/phone within reach;with family/visitor present ? ?OT Visit Diagnosis: Unsteadiness on feet (R26.81);Other abnormalities of gait and mobility (R26.89);Muscle weakness (generalized) (M62.81)  ?              ?Time: 3825-0539 ?OT Time Calculation (min): 23 min ?Charges:  OT General Charges ?$OT Visit: 1 Visit ?OT Evaluation ?$OT Eval Moderate Complexity: 1 Mod ?OT Treatments ?$Self Care/Home Management : 8-22 mins ? ?Ernesto Lashway H., OTR/L ?Acute Rehabilitation ? ?Martha Yang ?01/07/2022, 10:48 AM ?
